# Patient Record
Sex: Male | Born: 1965
Health system: Southern US, Community
[De-identification: ages and names within clinical notes are randomized; demographics above are authoritative.]

## PROBLEM LIST (undated history)

## (undated) DIAGNOSIS — M1990 Unspecified osteoarthritis, unspecified site: Secondary | ICD-10-CM

## (undated) DIAGNOSIS — E785 Hyperlipidemia, unspecified: Secondary | ICD-10-CM

## (undated) DIAGNOSIS — R0609 Other forms of dyspnea: Secondary | ICD-10-CM

## (undated) DIAGNOSIS — Z9109 Other allergy status, other than to drugs and biological substances: Secondary | ICD-10-CM

## (undated) DIAGNOSIS — R0600 Dyspnea, unspecified: Secondary | ICD-10-CM

## (undated) DIAGNOSIS — N200 Calculus of kidney: Secondary | ICD-10-CM

## (undated) DIAGNOSIS — T7840XA Allergy, unspecified, initial encounter: Secondary | ICD-10-CM

## (undated) DIAGNOSIS — K219 Gastro-esophageal reflux disease without esophagitis: Secondary | ICD-10-CM

## (undated) DIAGNOSIS — I10 Essential (primary) hypertension: Secondary | ICD-10-CM

## (undated) HISTORY — DX: Hyperlipidemia, unspecified: E78.5

## (undated) HISTORY — DX: Other forms of dyspnea: R06.09

## (undated) HISTORY — DX: Unspecified osteoarthritis, unspecified site: M19.90

## (undated) HISTORY — PX: KNEE ARTHROSCOPY: SUR90

## (undated) HISTORY — PX: WRIST FRACTURE SURGERY: SHX121

## (undated) HISTORY — DX: Essential (primary) hypertension: I10

## (undated) HISTORY — PX: CARPAL TUNNEL RELEASE: SHX101

## (undated) HISTORY — DX: Calculus of kidney: N20.0

## (undated) HISTORY — DX: Allergy, unspecified, initial encounter: T78.40XA

## (undated) HISTORY — DX: Gastro-esophageal reflux disease without esophagitis: K21.9

## (undated) HISTORY — DX: Dyspnea, unspecified: R06.00

---

## 2000-09-13 ENCOUNTER — Encounter: Payer: Self-pay | Admitting: Otolaryngology

## 2000-09-13 ENCOUNTER — Encounter: Admission: RE | Admit: 2000-09-13 | Discharge: 2000-09-13 | Payer: Self-pay | Admitting: Otolaryngology

## 2001-05-13 ENCOUNTER — Ambulatory Visit (HOSPITAL_COMMUNITY): Admission: RE | Admit: 2001-05-13 | Discharge: 2001-05-13 | Payer: Self-pay | Admitting: Orthopedic Surgery

## 2001-05-13 ENCOUNTER — Encounter: Payer: Self-pay | Admitting: Orthopedic Surgery

## 2004-10-14 ENCOUNTER — Ambulatory Visit (HOSPITAL_COMMUNITY): Admission: RE | Admit: 2004-10-14 | Discharge: 2004-10-14 | Payer: Self-pay | Admitting: Orthopedic Surgery

## 2009-02-05 ENCOUNTER — Ambulatory Visit: Payer: Self-pay | Admitting: Gastroenterology

## 2009-02-05 DIAGNOSIS — R1319 Other dysphagia: Secondary | ICD-10-CM

## 2009-02-05 DIAGNOSIS — K219 Gastro-esophageal reflux disease without esophagitis: Secondary | ICD-10-CM

## 2009-02-18 ENCOUNTER — Ambulatory Visit: Payer: Self-pay | Admitting: Gastroenterology

## 2010-07-15 ENCOUNTER — Encounter: Payer: Self-pay | Admitting: Internal Medicine

## 2010-07-15 ENCOUNTER — Other Ambulatory Visit: Payer: Self-pay | Admitting: Internal Medicine

## 2010-07-15 ENCOUNTER — Other Ambulatory Visit: Payer: 59

## 2010-07-15 ENCOUNTER — Institutional Professional Consult (permissible substitution) (INDEPENDENT_AMBULATORY_CARE_PROVIDER_SITE_OTHER): Payer: 59 | Admitting: Internal Medicine

## 2010-07-15 ENCOUNTER — Ambulatory Visit (INDEPENDENT_AMBULATORY_CARE_PROVIDER_SITE_OTHER)
Admission: RE | Admit: 2010-07-15 | Discharge: 2010-07-15 | Disposition: A | Discharge status: Home / Self Care | Payer: 59 | Source: Ambulatory Visit | Attending: Internal Medicine | Admitting: Internal Medicine

## 2010-07-15 DIAGNOSIS — R0989 Other specified symptoms and signs involving the circulatory and respiratory systems: Secondary | ICD-10-CM | POA: Insufficient documentation

## 2010-07-15 DIAGNOSIS — K219 Gastro-esophageal reflux disease without esophagitis: Secondary | ICD-10-CM

## 2010-07-15 DIAGNOSIS — R0609 Other forms of dyspnea: Secondary | ICD-10-CM

## 2010-07-15 DIAGNOSIS — I10 Essential (primary) hypertension: Secondary | ICD-10-CM

## 2010-07-15 LAB — CBC WITH DIFFERENTIAL/PLATELET
Basophils Absolute: 0 10*3/uL (ref 0.0–0.1)
Basophils Relative: 0.4 % (ref 0.0–3.0)
Eosinophils Absolute: 0.2 10*3/uL (ref 0.0–0.7)
Eosinophils Relative: 3 % (ref 0.0–5.0)
HCT: 44.1 % (ref 39.0–52.0)
Hemoglobin: 15.5 g/dL (ref 13.0–17.0)
Lymphocytes Relative: 28.6 % (ref 12.0–46.0)
Lymphs Abs: 1.9 10*3/uL (ref 0.7–4.0)
MCHC: 35 g/dL (ref 30.0–36.0)
MCV: 93.9 fl (ref 78.0–100.0)
Monocytes Absolute: 0.6 10*3/uL (ref 0.1–1.0)
Monocytes Relative: 9.2 % (ref 3.0–12.0)
Neutro Abs: 4 10*3/uL (ref 1.4–7.7)
Neutrophils Relative %: 58.8 % (ref 43.0–77.0)
Platelets: 132 10*3/uL — ABNORMAL LOW (ref 150.0–400.0)
RBC: 4.7 Mil/uL (ref 4.22–5.81)
RDW: 13.4 % (ref 11.5–14.6)
WBC: 6.8 10*3/uL (ref 4.5–10.5)

## 2010-07-15 LAB — BASIC METABOLIC PANEL
BUN: 15 mg/dL (ref 6–23)
CO2: 29 mEq/L (ref 19–32)
Calcium: 9.5 mg/dL (ref 8.4–10.5)
Chloride: 105 mEq/L (ref 96–112)
Creatinine, Ser: 1.4 mg/dL (ref 0.4–1.5)
GFR: 60.08 mL/min (ref >60.00)
Glucose, Bld: 87 mg/dL (ref 70–99)
Potassium: 4.3 mEq/L (ref 3.5–5.1)
Sodium: 140 mEq/L (ref 135–145)

## 2010-07-15 LAB — BRAIN NATRIURETIC PEPTIDE: Pro B Natriuretic peptide (BNP): 6.7 pg/mL (ref 0.0–100.0)

## 2010-07-19 NOTE — Assessment & Plan Note (Signed)
Summary: Pulmonary/ new pt evaluation/ hfa 90% p coaching   Visit Type:  Initial Consult Copy to:  Self Primary Provider/Referring Provider:  Laural Roes, PA  CC:  DOE.  History of Present Illness: 5 yowm never smoker with allergies going back to childhood "allergic to everything" rec shots by allergist (? sharma) mostly itchy sneezing runny nose  July 15, 2010  1st pulmonary office eval ov cc doe x 3 years, gradually worsening,  first noted playing baseball rx with inhaler no difference, no nocturnal complaints or early am awakening, only ex, no difference indoor or outdoor.  also has hb but has it under good control with omeprazole dosed as needed. assoc with cough on exp with minimal white mucus and nasal congestion.  Pt denies any significant sore throat, dysphagia, itching, sneezing,    excess or purulent nasal secretions,  fever, chills, sweats, unintended wt loss, pleuritic or exertional cp, hempoptysis, change in activity tolerance  orthopnea pnd or leg swelling Pt also denies any obvious fluctuation in symptoms with weather or environmental change or other alleviating or aggravating factors.       Current Medications (verified): 1)  Omeprazole 40 Mg Cpdr (Omeprazole) .Marland Kitchen.. 1 Once Daily 2)  Axiron 30 Mg/act Soln (Testosterone) .... As Directed Every Am  Allergies: 1)  ! Sulfa  Past History:  Past Medical History: Kidney Stones DOE     - Spriometry July 15, 2010 FEV1 = including midflows GERD, clinical dx only  Family History: No FH of Colon Cancer: Family History of Heart Disease: Father Emphysema- MGF Breast CA- Mother  Social History: Occupation: Designer, industrial/product at [employer] Patient has never smoked.  Married with children Alcohol Use - yes- moderate Daily Caffeine Use -3 Illicit Drug Use - no  Review of Systems       The patient complains of shortness of breath with activity, productive cough, non-productive cough, acid heartburn, weight  change, and nasal congestion/difficulty breathing through nose.  The patient denies shortness of breath at rest, coughing up blood, chest pain, irregular heartbeats, indigestion, loss of appetite, abdominal pain, difficulty swallowing, sore throat, tooth/dental problems, headaches, sneezing, itching, ear ache, anxiety, depression, hand/feet swelling, joint stiffness or pain, rash, change in color of mucus, and fever.    Vital Signs:  Patient profile:   45 year old male Height:      71 inches Weight:      232 pounds BMI:     32.47 O2 Sat:      98 % on Room air Temp:     97.9 degrees F oral Pulse rate:   86 / minute BP sitting:   150 / 94  (left arm)  Vitals Entered By: Vernie Murders (July 15, 2010 12:01 PM)  O2 Flow:  Room air  Physical Exam  Additional Exam:  wt 222  >  232 July 15, 2010  robust healthy appearing wm nad HEENT: nl dentition, turbinates, and orophanx. Nl external ear canals without cough reflex NECK :  without JVD/Nodes/TM/ nl carotid upstrokes bilaterally LUNGS: no acc muscle use, trace end exp wheeze with cough on exp maneuvers CV:  RRR  no s3 or murmur or increase in P2, no edema  ABD:  soft and nontender with nl excursion in the supine position. No bruits or organomegaly, bowel sounds nl MS:  warm without deformities, calf tenderness, cyanosis or clubbing SKIN: warm and dry without lesions   NEURO:  alert, approp, no deficits     White Cell Count  6.8 K/uL                    4.5-10.5   Red Cell Count            4.70 Mil/uL                 4.22-5.81   Hemoglobin                15.5 g/dL                   16.1-09.6   Hematocrit                44.1 %                      39.0-52.0   MCV                       93.9 fl                     78.0-100.0   MCHC                      35.0 g/dL                   04.5-40.9   RDW                       13.4 %                      11.5-14.6   Platelet Count       [L]  132.0 K/uL                  150.0-400.0    Neutrophil %              58.8 %                      43.0-77.0   Lymphocyte %              28.6 %                      12.0-46.0   Monocyte %                9.2 %                       3.0-12.0   Eosinophils%              3.0 %                       0.0-5.0   Basophils %               0.4 %                       0.0-3.0   Neutrophill Absolute      4.0 K/uL                    1.4-7.7   Lymphocyte Absolute       1.9 K/uL                    0.7-4.0   Monocyte Absolute         0.6 K/uL  0.1-1.0  Eosinophils, Absolute                             0.2 K/uL                    0.0-0.7   Basophils Absolute        0.0 K/uL                    0.0-0.1  Tests: (2) B-Type Natiuretic Peptide (BNPR)  B-Type Natriuetic Peptide                             6.7 pg/mL                   0.0-100.0  Tests: (3) BMP (METABOL)   Sodium                    140 mEq/L                   135-145   Potassium                 4.3 mEq/L                   3.5-5.1   Chloride                  105 mEq/L                   96-112   Carbon Dioxide            29 mEq/L                    19-32   Glucose                   87 mg/dL                    16-10   BUN                       15 mg/dL                    9-60   Creatinine                1.4 mg/dL                   4.5-4.0   Calcium                   9.5 mg/dL                   9.8-11.9   GFR                       60.08 mL/min                >60.00  CXR  Procedure date:  07/15/2010  Findings:      CHEST - 2 VIEW   Comparison: None.   Findings: Trachea is midline.  Heart size normal.  Lungs are clear. No pleural fluid.   IMPRESSION: No acute findings.  Impression & Recommendations:  Problem # 1:  DYSPNEA ON EXERTION (ICD-786.09)  ? EIA, ? ex exac LPR/ gerd.  against asthma is lack of response  to saba   DDX of  difficult airways managment all start with A and  include Adherence, Ace Inhibitors, Acid Reflux, Active Sinus Disease, Alpha 1  Antitripsin deficiency, Anxiety masquerading as Airways dz,  ABPA,  allergy(esp in young), Aspiration (esp in elderly), Adverse effects of DPI,  Active smokers, plus two Bs  = Bronchiectasis and Beta blocker use..and one C= CHF   Adherence: I spent extra time with the patient today explaining optimal mdi  technique.  This improved from  50-90% try dulera   ? acid reflux:  diet/ ppi reviewed   ? anxiety: dx of exclusion  His updated medication list for this problem includes:    Dulera 100-5 Mcg/act Aero (Mometasone furo-formoterol fum) .Marland Kitchen... 2 puffs first thing  in am and 2 puffs again in pm about 12 hours later  Problem # 2:  HYPERTENSION, BENIGN (ICD-401.1)  discussed non medicaton treatment for now as BNP <<< 100 so strongly doubt contibuting to sob    Problem # 3:  GERD (ICD-530.81)  His updated medication list for this problem includes:    Omeprazole 40 Mg Cpdr (Omeprazole) .Marland Kitchen... Take  one 30-60 min before first meal of the day  clinical dx only  Orders: New Patient Level V (16109)  Medications Added to Medication List This Visit: 1)  Omeprazole 40 Mg Cpdr (Omeprazole) .Marland Kitchen.. 1 once daily 2)  Omeprazole 40 Mg Cpdr (Omeprazole) .... Take  one 30-60 min before first meal of the day 3)  Axiron 30 Mg/act Soln (Testosterone) .... As directed every am 4)  Dulera 100-5 Mcg/act Aero (Mometasone furo-formoterol fum) .... 2 puffs first thing  in am and 2 puffs again in pm about 12 hours later  Other Orders: T-Allergy Profile Region II-DC, [place], [place], [place], [place] (5484) T-2 View CXR (71020TC) TLB-CBC Platelet - w/Differential (85025-CBCD) TLB-BNP (B-Natriuretic Peptide) (83880-BNPR) TLB-BMP (Basic Metabolic Panel-BMET) (80048-METABOL)  Patient Instructions: 1)  Be sure to take omeprazole Take  one 30-60 min before first meal of the day  2)  GERD (REFLUX)  is a common cause of respiratory symptoms. It commonly presents without heartburn and can be treated with medication, but also with  lifestyle changes including avoidance of late meals, excessive alcohol, smoking cessation, and avoid fatty foods, chocolate, peppermint, colas, red wine, and acidic juices such as orange juice. NO MINT OR MENTHOL PRODUCTS SO NO COUGH DROPS  3)  USE SUGARLESS CANDY INSTEAD (jolley ranchers)  4)  NO OIL BASED VITAMINS  5)  Dulera 100 2puff every 12 hours as needed  but especially before exercise hold for up to 5 seconds and then out through the nose 6)  avoid salt 7)  Please schedule a follow-up appointment in 4 weeks, sooner if needed    CXR  Procedure date:  07/15/2010  Findings:      CHEST - 2 VIEW   Comparison: None.   Findings: Trachea is midline.  Heart size normal.  Lungs are clear. No pleural fluid.   IMPRESSION: No acute findings.

## 2010-07-19 NOTE — Letter (Signed)
Summary: [facility]  [address]   [place], [place] [address]   Phone: [phone]  Fax: [phone]    07/15/2010  Shawn Roberts [address] [place], [place]  [address]  [place]  Dear [name],   Please contact our office at [phone] to obtain recent lab and chest x-ray results. Thank You.        Sincerely,   [facility]

## 2010-07-20 ENCOUNTER — Telehealth (INDEPENDENT_AMBULATORY_CARE_PROVIDER_SITE_OTHER): Payer: Self-pay | Admitting: *Deleted

## 2010-07-20 LAB — CONVERTED CEMR LAB: IgE (Immunoglobulin E), Serum: 1.7 intl units/mL (ref 0.0–180.0)

## 2010-07-28 NOTE — Progress Notes (Signed)
Summary: received a letter to call regarding lab resutls  Phone Note Call from Patient   Caller: Patient Call For: wert Summary of Call: patient phoned stated that he recieved a letter stating that he needed to call our office regarding lab resutls. He can be reached at [phone] Initial call taken by: Vedia Coffer,  July 20, 2010 5:08 PM  Follow-up for Phone Call        Spoke with pt and notified of results/recs of cxr and labs per MW append.  Pt verbalized understanding.  Follow-up by: Vernie Murders,  July 20, 2010 5:14 PM

## 2010-08-03 ENCOUNTER — Encounter: Payer: Self-pay | Admitting: Internal Medicine

## 2010-08-12 ENCOUNTER — Ambulatory Visit (INDEPENDENT_AMBULATORY_CARE_PROVIDER_SITE_OTHER): Payer: 59 | Admitting: Internal Medicine

## 2010-08-12 ENCOUNTER — Encounter: Payer: Self-pay | Admitting: Internal Medicine

## 2010-08-12 VITALS — BP 130/80 | HR 91 | Temp 98.4°F | Ht 71.0 in | Wt 226.0 lb

## 2010-08-12 DIAGNOSIS — R05 Cough: Secondary | ICD-10-CM

## 2010-08-12 DIAGNOSIS — R0989 Other specified symptoms and signs involving the circulatory and respiratory systems: Secondary | ICD-10-CM

## 2010-08-12 DIAGNOSIS — R0609 Other forms of dyspnea: Secondary | ICD-10-CM

## 2010-08-12 MED ORDER — MONTELUKAST SODIUM 10 MG PO TABS: 10.0000 mg | ORAL_TABLET | Freq: Every day | ORAL | Status: DC

## 2010-08-12 NOTE — Patient Instructions (Signed)
Start singulair 10 mg one at bedtime and stop all inhalers   For tickling rec you try clortrimeton 4 mg one every 6 hours as needed (Zyrtec or  Clariton options if too sleepy)  If not satisfied call Almyra Free in one week for CPST before and after bronchodilators.  Stay on omeprazole 40 mg 30 min before breakfast and Pepcid 20 mg one at bedtime until completely better.

## 2010-08-12 NOTE — Progress Notes (Signed)
Subjective:    Patient ID: Shawn Roberts, male    DOB: 10-29-1965, 45 y.o.   MRN: [id number]  HPI 47 yowm never smoker with allergies going back to childhood "allergic to everything" rec shots by allergist (? sharma) mostly itchy sneezing runny nose   July 15, 2010 1st pulmonary office eval ov cc doe x 3 years, gradually worsening, first noted playing baseball rx with inhaler no difference, no nocturnal complaints or early am awakening, only ex, no difference indoor or outdoor. also has hb but has it under good control with omeprazole dosed as needed.   Meds 2/24: 1) Omeprazole 40 Mg Cpdr (Omeprazole) .Marland Kitchen.. 1 Once Daily  2) Axiron 30 Mg/act Soln (Testosterone) .... As Directed Every Am    White Cell Count 6.8 K/uL 4.5-10.5  Red Cell Count 4.70 Mil/uL 4.22-5.81  Hemoglobin 15.5 g/dL 40.9-81.1  Hematocrit 91.4 % 39.0-52.0  MCV 93.9 fl 78.0-100.0  MCHC 35.0 g/dL 78.2-95.6  RDW 21.3 % 08.6-57.8  Platelet Count [L] 132.0 K/uL 150.0-400.0  Neutrophil % 58.8 % 43.0-77.0  Lymphocyte % 28.6 % 12.0-46.0  Monocyte % 9.2 % 3.0-12.0  Eosinophils% 3.0 % 0.0-5.0  Basophils % 0.4 % 0.0-3.0  Neutrophill Absolute 4.0 K/uL 1.4-7.7  Lymphocyte Absolute 1.9 K/uL 0.7-4.0  Monocyte Absolute 0.6 K/uL 0.1-1.0  Eosinophils, Absolute  0.2 K/uL 0.0-0.7  Basophils Absolute 0.0 K/uL 0.0-0.1  Tests: (2) B-Type Natiuretic Peptide (BNPR)  B-Type Natriuetic Peptide  6.7 pg/mL 0.0-100.0  Tests: (3) BMP (METABOL)  Sodium 140 mEq/L 135-145  Potassium 4.3 mEq/L 3.5-5.1  Chloride 105 mEq/L 96-112  Carbon Dioxide 29 mEq/L 19-32  Glucose 87 mg/dL 46-96  BUN 15 mg/dL 2-95  Creatinine 1.4 mg/dL 2.8-4.1  Calcium 9.5 mg/dL 3.2-44.0  GFR 10.27 mL/min >60.00  CXR  Procedure date: 07/15/2010  Findings:  CHEST - 2 VIEW No acute findings.   Imp  ? Asthma vs upper airway cough syndrome REC 1)  take omeprazole Take one 30-60 min before first meal of the day  2) GERD (REFLUX)diet      5) Dulera 100 2puff every  12 hours as needed but especially before exercise hold for up to 5 seconds and then out through the nose  6) avoid salt   08/12/10 ov no better at all in terms of control of urge to clear throat or reproducible doe x 2 flts, much different than baseline Pt denies any significant sore throat, dysphagia, itching, sneezing,  nasal congestion or excess/ purulent secretions,  fever, chills, sweats, unintended wt loss, pleuritic or exertional cp, hempoptysis, orthopnea pnd or leg swelling.    Also denies any obvious fluctuation of symptoms with weather or environmental changes or other aggravating or alleviating factors.        Past Medical History:  Kidney Stones  DOE  - Spriometry July 15, 2010 FEV1 = including midflows  GERD, clinical dx only   Family History:  No FH of Colon Cancer:  Family History of Heart Disease: Father  Emphysema- MGF  Breast CA- Mother   Social History:  Occupation: Designer, industrial/product at [employer]  Patient has never smoked.  Married with children  Alcohol Use - yes- moderate  Daily Caffeine Use -3  Illicit Drug Use - no  Additional Exam: wt 222 > 232 July 15, 2010  robust healthy appearing wm nad  HEENT: nl dentition, turbinates, and orophanx. Nl external ear canals without cough reflex  NECK : without JVD/Nodes/TM/ nl carotid upstrokes bilaterally  LUNGS: no acc muscle use, trace  end exp wheeze with cough on exp maneuvers  CV: RRR no s3 or murmur or increase in P2, no edema  ABD: soft and nontender with nl excursion in the supine position. No bruits or organomegaly, bowel sounds nl  MS: warm without deformities, calf tenderness, cyanosis or clubbing  SKIN: warm and dry without lesions  NEURO: alert, approp, no deficits     Review of Systems     Objective:   Physical Exam    Additional Exam: wt 222 > 232 July 15, 2010  > 226 07/14/10 robust healthy appearing wm nad  HEENT: nl dentition, turbinates, and orophanx. Nl external ear canals  without cough reflex  NECK : without JVD/Nodes/TM/ nl carotid upstrokes bilaterally  LUNGS: no acc muscle use, trace end exp wheeze with cough on exp maneuvers  CV: RRR no s3 or murmur or increase in P2, no edema  ABD: soft and nontender with nl excursion in the supine position. No bruits or organomegaly, bowel sounds nl  MS: warm without deformities, calf tenderness, cyanosis or clubbing  SKIN: warm and dry without lesions  NEURO: alert, approp, no deficits     Assessment & Plan:

## 2010-08-13 ENCOUNTER — Encounter: Payer: Self-pay | Admitting: Internal Medicine

## 2010-08-13 DIAGNOSIS — R059 Cough, unspecified: Secondary | ICD-10-CM | POA: Insufficient documentation

## 2010-08-13 DIAGNOSIS — R05 Cough: Secondary | ICD-10-CM | POA: Insufficient documentation

## 2010-08-13 NOTE — Assessment & Plan Note (Addendum)
stronly doubt asthma as cause as no benefit from duleara, albeit with suboptimal hfa  Try singulair since it may also help with apparent pnds but next step is CPST with spirometry before and after

## 2010-08-13 NOTE — Assessment & Plan Note (Signed)
Classic Upper airway cough syndrome, so named because it's frequently impossible to sort out how much is  CR/sinusitis with freq throat clearing (which can be related to primary GERD)   vs  causing  secondary (" extra esophageal")  GERD from wide swings in gastric pressure that occur with throat clearing, often  promoting self use of mint and menthol lozenges that reduce the lower esophageal sphincter tone and exacerbate the problem further in a cyclical fashion.   These are the same pts who not infrequently have failed to tolerate ace inhibitors,  dry powder inhalers or biphosphonates or report having reflux symptoms that don't respond to standard doses of PPI , and are easily confused as having aecopd or asthma flares,    So need to continue gerd rx for now PLUS  Start singulair 10 mg one at bedtime and stop all inhalers and add H1 per guidelines

## 2013-02-25 ENCOUNTER — Encounter: Payer: Self-pay | Admitting: Gastroenterology

## 2013-04-02 ENCOUNTER — Encounter: Payer: Self-pay | Admitting: Gastroenterology

## 2013-04-02 ENCOUNTER — Ambulatory Visit (INDEPENDENT_AMBULATORY_CARE_PROVIDER_SITE_OTHER): Payer: 59 | Admitting: Gastroenterology

## 2013-04-02 VITALS — BP 120/80 | HR 96 | Ht 71.0 in | Wt 231.0 lb

## 2013-04-02 DIAGNOSIS — R140 Abdominal distension (gaseous): Secondary | ICD-10-CM

## 2013-04-02 DIAGNOSIS — R141 Gas pain: Secondary | ICD-10-CM

## 2013-04-02 DIAGNOSIS — K219 Gastro-esophageal reflux disease without esophagitis: Secondary | ICD-10-CM

## 2013-04-02 DIAGNOSIS — R197 Diarrhea, unspecified: Secondary | ICD-10-CM

## 2013-04-02 NOTE — Patient Instructions (Addendum)
Follow instructions on Hemoccult cards and mail them back to [place] when finished.   You have been given a Low gas diet.   Take over the counter Gas-x three times a day with meals for gas and bloating.  We have given you a coupon for Align. This puts good bacteria back into your colon. You should take 1 capsule by mouth once daily. This can be purchased over the counter.   Thank you for choosing me and [facility].  Venita Lick. Pleas Koch., MD., Clementeen Graham  cc: Lawerance Sabal, [place]

## 2013-04-02 NOTE — Progress Notes (Signed)
    History of Present Illness: This is a 47 year old male who relates a several month history of postprandial generalized abdominal bloating associated with increased gas, belching and flatulence. He has occasional episodes of diarrhea that are not correlated with his bloating. His father has a history of colon polyps around age 21 however the patient relates that the polyps were not precancerous. He underwent upper endoscopy in 2010 for GERD and dysphasia examination was normal. He's been maintained on omeprazole 40 mg daily with good control of his reflux symptoms Denies weight loss, abdominal pain, constipation, change in stool caliber, melena, hematochezia, nausea, vomiting, dysphagia, reflux symptoms, chest pain.  Review of Systems: Pertinent positive and negative review of systems were noted in the above HPI section. All other review of systems were otherwise negative.  Current Medications, Allergies, Past Medical History, Past Surgical History, Family History and Social History were reviewed in [employer] record.  Physical Exam: General: Well developed , well nourished, no acute distress Head: Normocephalic and atraumatic Eyes:  sclerae anicteric, EOMI Ears: Normal auditory acuity Mouth: No deformity or lesions Neck: Supple, no masses or thyromegaly Lungs: Clear throughout to auscultation Heart: Regular rate and rhythm; no murmurs, rubs or bruits Abdomen: Soft, non tender and non distended. No masses, hepatosplenomegaly or hernias noted. Normal Bowel sounds Musculoskeletal: Symmetrical with no gross deformities  Skin: No lesions on visible extremities Pulses:  Normal pulses noted Extremities: No clubbing, cyanosis, edema or deformities noted Neurological: Alert oriented x 4, grossly nonfocal Cervical Nodes:  No significant cervical adenopathy Inguinal Nodes: No significant inguinal adenopathy Psychological:  Alert and cooperative. Normal mood and  affect  Assessment and Recommendations:  1. Abdominal bloating, gas, belching, flatulence and intermittent diarrhea.. Obtain stool Hemoccults and recent blood work from his PCPs office. Begin a low gas diet. Begin daily Align. Begin Gas-X 4 times a day when necessary. Return office is in 2-3 weeks. If Hemoccults are positive or symptoms have not abated consider colonoscopy for further evaluation. If symptoms abate plan for routine screening colonoscopy at age 64.  2. GERD. Continue omeprazole 40 mg daily and standard antireflux measures. EGD in 2010 was normal.

## 2013-04-16 ENCOUNTER — Other Ambulatory Visit (INDEPENDENT_AMBULATORY_CARE_PROVIDER_SITE_OTHER): Payer: 59

## 2013-04-16 DIAGNOSIS — R141 Gas pain: Secondary | ICD-10-CM

## 2013-04-16 DIAGNOSIS — R140 Abdominal distension (gaseous): Secondary | ICD-10-CM

## 2013-04-16 DIAGNOSIS — R197 Diarrhea, unspecified: Secondary | ICD-10-CM

## 2013-04-16 LAB — HEMOCCULT SLIDES (X 3 CARDS)
OCCULT 1: NEGATIVE
OCCULT 3: NEGATIVE
OCCULT 4: NEGATIVE
OCCULT 5: NEGATIVE

## 2013-04-29 ENCOUNTER — Ambulatory Visit: Payer: 59 | Admitting: Gastroenterology

## 2013-07-10 ENCOUNTER — Encounter (HOSPITAL_COMMUNITY): Payer: Self-pay | Admitting: Pharmacy Technician

## 2013-07-11 ENCOUNTER — Encounter (HOSPITAL_COMMUNITY): Payer: Self-pay

## 2013-07-11 ENCOUNTER — Encounter (HOSPITAL_COMMUNITY)
Admission: RE | Admit: 2013-07-11 | Discharge: 2013-07-11 | Disposition: A | Discharge status: Home / Self Care | Payer: 59 | Source: Ambulatory Visit | Attending: Orthopedic Surgery | Admitting: Orthopedic Surgery

## 2013-07-11 ENCOUNTER — Other Ambulatory Visit (HOSPITAL_COMMUNITY): Payer: Self-pay | Admitting: *Deleted

## 2013-07-11 DIAGNOSIS — Z01812 Encounter for preprocedural laboratory examination: Secondary | ICD-10-CM | POA: Insufficient documentation

## 2013-07-11 HISTORY — DX: Other allergy status, other than to drugs and biological substances: Z91.09

## 2013-07-11 LAB — CBC
HCT: 43.9 % (ref 39.0–52.0)
Hemoglobin: 15.8 g/dL (ref 13.0–17.0)
MCH: 32.4 pg (ref 26.0–34.0)
MCHC: 36 g/dL (ref 30.0–36.0)
MCV: 90.1 fL (ref 78.0–100.0)
PLATELETS: 162 10*3/uL (ref 150–400)
RBC: 4.87 MIL/uL (ref 4.22–5.81)
RDW: 12.7 % (ref 11.5–15.5)
WBC: 5.8 10*3/uL (ref 4.0–10.5)

## 2013-07-11 NOTE — Progress Notes (Signed)
Called Dr. Susie Cassette office for pre-op orders. Left message with Abigail Butts (scheduler).

## 2013-07-11 NOTE — Pre-Procedure Instructions (Signed)
Shawn Roberts  07/11/2013   Your procedure is scheduled on:  Tuesday, July 15, 2013 at 2:00 PM.   Report to [facility] Entrance "A" Admitting Office at 12:00 PM.   Call this number if you have problems the morning of surgery: [phone]   Remember:   Do not eat food or drink liquids after midnight Monday, 07/14/13.   Take these medicines the morning of surgery with A SIP OF WATER: omeprazole (PRILOSEC)    Do not wear jewelry.  Do not wear lotions, powders, or cologne. You may NOT wear deodorant.  Do not shave 48 hours prior to surgery. Men may shave face and neck.  Do not bring valuables to the hospital.  [facility] is not responsible                  for any belongings or valuables.               Contacts, dentures or bridgework may not be worn into surgery.  Leave suitcase in the car. After surgery it may be brought to your room.  For patients admitted to the hospital, discharge time is determined by your                treatment team.               Patients discharged the day of surgery will not be allowed to drive  home.  Name and phone number of your driver: Family/friend   Special Instructions: [facility] - Preparing for Surgery  Before surgery, you can play an important role.  Because skin is not sterile, your skin needs to be as free of germs as possible.  You can reduce the number of germs on you skin by washing with CHG (chlorahexidine gluconate) soap before surgery.  CHG is an antiseptic cleaner which kills germs and bonds with the skin to continue killing germs even after washing.  Please DO NOT use if you have an allergy to CHG or antibacterial soaps.  If your skin becomes reddened/irritated stop using the CHG and inform your nurse when you arrive at Short Stay.  Do not shave (including legs and underarms) for at least 48 hours prior to the first CHG shower.  You may shave your face.  Please follow these instructions carefully:   1.  Shower with CHG  Soap the night before surgery and the                                morning of Surgery.  2.  If you choose to wash your hair, wash your hair first as usual with your       normal shampoo.  3.  After you shampoo, rinse your hair and body thoroughly to remove the                      Shampoo.  4.  Use CHG as you would any other liquid soap.  You can apply chg directly       to the skin and wash gently with scrungie or a clean washcloth.  5.  Apply the CHG Soap to your body ONLY FROM THE NECK DOWN.        Do not use on open wounds or open sores.  Avoid contact with your eyes, ears, mouth and genitals (private parts).  Wash genitals (private parts) with your normal soap.  6.  Wash thoroughly, paying special attention to the area where your surgery        will be performed.  7.  Thoroughly rinse your body with warm water from the neck down.  8.  DO NOT shower/wash with your normal soap after using and rinsing off       the CHG Soap.  9.  Pat yourself dry with a clean towel.            10.  Wear clean pajamas.            11.  Place clean sheets on your bed the night of your first shower and do not        sleep with pets.  Day of Surgery  Do not apply any lotions/deodorants the morning of surgery.  Please wear clean clothes to the hospital/surgery center.      Please read over the following fact sheets that you were given: Pain Booklet, Coughing and Deep Breathing and Surgical Site Infection Prevention

## 2013-07-14 MED ORDER — LACTATED RINGERS IV SOLN
INTRAVENOUS | Status: DC
  Administered 2013-07-15: 12:00:00 via INTRAVENOUS

## 2013-07-14 MED ORDER — CHLORHEXIDINE GLUCONATE 4 % EX LIQD
60.0000 mL | Freq: Once | CUTANEOUS | Status: DC
  Filled 2013-07-14: qty 60

## 2013-07-14 MED ORDER — CEFAZOLIN SODIUM-DEXTROSE 2-3 GM-% IV SOLR
2.0000 g | INTRAVENOUS | Status: AC
  Administered 2013-07-15: 2 g via INTRAVENOUS
  Filled 2013-07-14: qty 50

## 2013-07-15 ENCOUNTER — Ambulatory Visit (HOSPITAL_COMMUNITY)
Admission: RE | Admit: 2013-07-15 | Discharge: 2013-07-15 | Disposition: A | Discharge status: Home / Self Care | Payer: 59 | Source: Ambulatory Visit | Attending: Orthopedic Surgery | Admitting: Orthopedic Surgery

## 2013-07-15 ENCOUNTER — Encounter (HOSPITAL_COMMUNITY)
Admission: RE | Disposition: A | Discharge status: Home / Self Care | Payer: Self-pay | Source: Ambulatory Visit | Attending: Orthopedic Surgery

## 2013-07-15 ENCOUNTER — Encounter (HOSPITAL_COMMUNITY): Payer: Self-pay | Admitting: *Deleted

## 2013-07-15 ENCOUNTER — Encounter (HOSPITAL_COMMUNITY): Payer: 59 | Admitting: Anesthesiology

## 2013-07-15 ENCOUNTER — Ambulatory Visit (HOSPITAL_COMMUNITY): Payer: 59 | Admitting: Anesthesiology

## 2013-07-15 DIAGNOSIS — K219 Gastro-esophageal reflux disease without esophagitis: Secondary | ICD-10-CM | POA: Insufficient documentation

## 2013-07-15 DIAGNOSIS — X58XXXA Exposure to other specified factors, initial encounter: Secondary | ICD-10-CM | POA: Insufficient documentation

## 2013-07-15 DIAGNOSIS — M249 Joint derangement, unspecified: Secondary | ICD-10-CM | POA: Insufficient documentation

## 2013-07-15 DIAGNOSIS — IMO0002 Reserved for concepts with insufficient information to code with codable children: Secondary | ICD-10-CM | POA: Insufficient documentation

## 2013-07-15 DIAGNOSIS — M75100 Unspecified rotator cuff tear or rupture of unspecified shoulder, not specified as traumatic: Secondary | ICD-10-CM | POA: Insufficient documentation

## 2013-07-15 DIAGNOSIS — S43439A Superior glenoid labrum lesion of unspecified shoulder, initial encounter: Secondary | ICD-10-CM | POA: Insufficient documentation

## 2013-07-15 DIAGNOSIS — M9420 Chondromalacia, unspecified site: Secondary | ICD-10-CM | POA: Insufficient documentation

## 2013-07-15 HISTORY — PX: SHOULDER ARTHROSCOPY WITH DEBRIDEMENT AND BICEP TENDON REPAIR: SHX5690

## 2013-07-15 LAB — PROTIME-INR
INR: 0.99 (ref 0.00–1.49)
PROTHROMBIN TIME: 12.9 s (ref 11.6–15.2)

## 2013-07-15 LAB — COMPREHENSIVE METABOLIC PANEL
ALBUMIN: 4.3 g/dL (ref 3.5–5.2)
ALK PHOS: 61 U/L (ref 39–117)
ALT: 38 U/L (ref 0–53)
AST: 24 U/L (ref 0–37)
BUN: 17 mg/dL (ref 6–23)
CHLORIDE: 103 meq/L (ref 96–112)
CO2: 27 mEq/L (ref 19–32)
CREATININE: 1.23 mg/dL (ref 0.50–1.35)
Calcium: 9.8 mg/dL (ref 8.4–10.5)
GFR calc Af Amer: 80 mL/min — ABNORMAL LOW (ref >90)
GFR calc non Af Amer: 69 mL/min — ABNORMAL LOW (ref >90)
Glucose, Bld: 92 mg/dL (ref 70–99)
POTASSIUM: 4.4 meq/L (ref 3.7–5.3)
Sodium: 144 mEq/L (ref 137–147)
Total Bilirubin: 0.6 mg/dL (ref 0.3–1.2)
Total Protein: 7.5 g/dL (ref 6.0–8.3)

## 2013-07-15 LAB — DIFFERENTIAL
BASOS ABS: 0 10*3/uL (ref 0.0–0.1)
BASOS PCT: 0 % (ref 0–1)
EOS ABS: 0.1 10*3/uL (ref 0.0–0.7)
Eosinophils Relative: 2 % (ref 0–5)
Lymphocytes Relative: 35 % (ref 12–46)
Lymphs Abs: 2.4 10*3/uL (ref 0.7–4.0)
Monocytes Absolute: 0.5 10*3/uL (ref 0.1–1.0)
Monocytes Relative: 7 % (ref 3–12)
NEUTROS ABS: 3.8 10*3/uL (ref 1.7–7.7)
NEUTROS PCT: 56 % (ref 43–77)

## 2013-07-15 LAB — APTT: aPTT: 25 seconds (ref 24–37)

## 2013-07-15 SURGERY — SHOULDER ARTHROSCOPY WITH DEBRIDEMENT AND BICEP TENDON REPAIR
Anesthesia: General | Site: Shoulder | Laterality: Right

## 2013-07-15 MED ORDER — ROCURONIUM BROMIDE 100 MG/10ML IV SOLN
INTRAVENOUS | Status: DC | PRN
  Administered 2013-07-15: 30 mg via INTRAVENOUS
  Administered 2013-07-15: 20 mg via INTRAVENOUS

## 2013-07-15 MED ORDER — OXYCODONE-ACETAMINOPHEN 5-325 MG PO TABS
ORAL_TABLET | ORAL | Status: AC
  Filled 2013-07-15: qty 2

## 2013-07-15 MED ORDER — FENTANYL CITRATE 0.05 MG/ML IJ SOLN
INTRAMUSCULAR | Status: AC
  Filled 2013-07-15: qty 5

## 2013-07-15 MED ORDER — LABETALOL HCL 5 MG/ML IV SOLN
INTRAVENOUS | Status: AC
  Filled 2013-07-15: qty 4

## 2013-07-15 MED ORDER — HYDROMORPHONE HCL PF 1 MG/ML IJ SOLN
0.2500 mg | INTRAMUSCULAR | Status: DC | PRN
  Administered 2013-07-15 (×4): 0.5 mg via INTRAVENOUS

## 2013-07-15 MED ORDER — ONDANSETRON HCL 4 MG/2ML IJ SOLN: 4.0000 mg | Freq: Once | INTRAMUSCULAR | Status: DC | PRN

## 2013-07-15 MED ORDER — PROPOFOL 10 MG/ML IV BOLUS
INTRAVENOUS | Status: DC | PRN
  Administered 2013-07-15: 40 mg via INTRAVENOUS
  Administered 2013-07-15: 360 mg via INTRAVENOUS

## 2013-07-15 MED ORDER — LACTATED RINGERS IV SOLN
INTRAVENOUS | Status: DC | PRN
  Administered 2013-07-15: 13:00:00 via INTRAVENOUS

## 2013-07-15 MED ORDER — SUCCINYLCHOLINE CHLORIDE 20 MG/ML IJ SOLN
INTRAMUSCULAR | Status: DC | PRN
Start: 2013-07-15 — End: 2013-07-15
  Administered 2013-07-15: 100 mg via INTRAVENOUS

## 2013-07-15 MED ORDER — DIAZEPAM 5 MG PO TABS: 2.5000 mg | ORAL_TABLET | Freq: Four times a day (QID) | ORAL | Status: DC | PRN

## 2013-07-15 MED ORDER — OXYCODONE-ACETAMINOPHEN 5-325 MG PO TABS: 1.0000 | ORAL_TABLET | ORAL | Status: DC | PRN

## 2013-07-15 MED ORDER — ONDANSETRON HCL 4 MG/2ML IJ SOLN
INTRAMUSCULAR | Status: AC
  Filled 2013-07-15: qty 2

## 2013-07-15 MED ORDER — SODIUM CHLORIDE 0.9 % IR SOLN
Status: DC | PRN
  Administered 2013-07-15: 3000 mL

## 2013-07-15 MED ORDER — FENTANYL CITRATE 0.05 MG/ML IJ SOLN
INTRAMUSCULAR | Status: DC | PRN
  Administered 2013-07-15: 100 ug via INTRAVENOUS

## 2013-07-15 MED ORDER — BUPIVACAINE-EPINEPHRINE PF 0.5-1:200000 % IJ SOLN
INTRAMUSCULAR | Status: DC | PRN
  Administered 2013-07-15: 15 mL via PERINEURAL

## 2013-07-15 MED ORDER — OXYCODONE-ACETAMINOPHEN 5-325 MG PO TABS
2.0000 | ORAL_TABLET | ORAL | Status: AC
  Administered 2013-07-15: 2 via ORAL

## 2013-07-15 MED ORDER — GLYCOPYRROLATE 0.2 MG/ML IJ SOLN
INTRAMUSCULAR | Status: DC | PRN
  Administered 2013-07-15: .4 mg via INTRAVENOUS

## 2013-07-15 MED ORDER — DIAZEPAM 5 MG PO TABS
ORAL_TABLET | ORAL | Status: AC
  Filled 2013-07-15: qty 1

## 2013-07-15 MED ORDER — HYDROMORPHONE HCL PF 1 MG/ML IJ SOLN
INTRAMUSCULAR | Status: AC
  Filled 2013-07-15: qty 1

## 2013-07-15 MED ORDER — LIDOCAINE HCL (CARDIAC) 20 MG/ML IV SOLN
INTRAVENOUS | Status: AC
  Filled 2013-07-15: qty 5

## 2013-07-15 MED ORDER — NEOSTIGMINE METHYLSULFATE 1 MG/ML IJ SOLN
INTRAMUSCULAR | Status: DC | PRN
  Administered 2013-07-15: 2 mg via INTRAVENOUS

## 2013-07-15 MED ORDER — ROCURONIUM BROMIDE 50 MG/5ML IV SOLN
INTRAVENOUS | Status: AC
  Filled 2013-07-15: qty 1

## 2013-07-15 MED ORDER — NAPROXEN 500 MG PO TABS: 500.0000 mg | ORAL_TABLET | Freq: Two times a day (BID) | ORAL | Status: DC

## 2013-07-15 MED ORDER — LIDOCAINE HCL 4 % MT SOLN
OROMUCOSAL | Status: DC | PRN
  Administered 2013-07-15: 2 mL via TOPICAL

## 2013-07-15 MED ORDER — LABETALOL HCL 5 MG/ML IV SOLN
INTRAVENOUS | Status: DC | PRN
  Administered 2013-07-15: 5 mg via INTRAVENOUS

## 2013-07-15 MED ORDER — PROPOFOL 10 MG/ML IV BOLUS
INTRAVENOUS | Status: AC
  Filled 2013-07-15: qty 20

## 2013-07-15 MED ORDER — DIAZEPAM 5 MG PO TABS
5.0000 mg | ORAL_TABLET | Freq: Once | ORAL | Status: AC
  Administered 2013-07-15: 5 mg via ORAL

## 2013-07-15 MED ORDER — GLYCOPYRROLATE 0.2 MG/ML IJ SOLN
INTRAMUSCULAR | Status: AC
  Filled 2013-07-15: qty 2

## 2013-07-15 MED ORDER — MIDAZOLAM HCL 2 MG/2ML IJ SOLN
INTRAMUSCULAR | Status: AC
  Filled 2013-07-15: qty 2

## 2013-07-15 MED ORDER — ONDANSETRON HCL 4 MG/2ML IJ SOLN
INTRAMUSCULAR | Status: DC | PRN
  Administered 2013-07-15: 4 mg via INTRAVENOUS

## 2013-07-15 MED ORDER — NEOSTIGMINE METHYLSULFATE 1 MG/ML IJ SOLN
INTRAMUSCULAR | Status: AC
  Filled 2013-07-15: qty 10

## 2013-07-15 MED ORDER — LIDOCAINE HCL (CARDIAC) 20 MG/ML IV SOLN
INTRAVENOUS | Status: DC | PRN
  Administered 2013-07-15: 100 mg via INTRAVENOUS

## 2013-07-15 SURGICAL SUPPLY — 65 items
BLADE CUTTER GATOR 3.5 (BLADE) ×3 IMPLANT
BLADE GREAT WHITE 4.2 (BLADE) ×2 IMPLANT
BLADE GREAT WHITE 4.2MM (BLADE) ×1
BLADE SURG 11 STRL SS (BLADE) ×3 IMPLANT
BOOTCOVER CLEANROOM LRG (PROTECTIVE WEAR) ×6 IMPLANT
BUR 3.5 LG SPHERICAL (BURR) ×1 IMPLANT
BUR OVAL 4.0 (BURR) ×3 IMPLANT
BURR 3.5 LG SPHERICAL (BURR) ×2
BURR 3.5MM LG SPHERICAL (BURR) ×1
CANISTER SUCT LVC 12 LTR MEDI- (MISCELLANEOUS) ×3 IMPLANT
CANNULA ACUFLEX KIT 5X76 (CANNULA) ×3 IMPLANT
CANNULA DRILOCK 5.0MMX75MM (CANNULA)
CANNULA DRILOCK 5.0X75 (CANNULA) IMPLANT
CLOSURE STERI-STRIP 1/4X4 (GAUZE/BANDAGES/DRESSINGS) ×3 IMPLANT
CLOSURE WOUND 1/2 X4 (GAUZE/BANDAGES/DRESSINGS) ×1
CLOTH BEACON ORANGE TIMEOUT ST (SAFETY) ×3 IMPLANT
CONNECTOR 5 IN 1 STRAIGHT STRL (MISCELLANEOUS) ×3 IMPLANT
DRAPE INCISE 23X17 IOBAN STRL (DRAPES) ×2
DRAPE INCISE IOBAN 23X17 STRL (DRAPES) ×1 IMPLANT
DRAPE INCISE IOBAN 66X45 STRL (DRAPES) ×3 IMPLANT
DRAPE STERI 35X30 U-POUCH (DRAPES) ×3 IMPLANT
DRAPE SURG 17X11 SM STRL (DRAPES) ×3 IMPLANT
DRAPE U-SHAPE 47X51 STRL (DRAPES) ×3 IMPLANT
DRSG PAD ABDOMINAL 8X10 ST (GAUZE/BANDAGES/DRESSINGS) ×6 IMPLANT
DURAPREP 26ML APPLICATOR (WOUND CARE) ×6 IMPLANT
ELECT REM PT RETURN 9FT ADLT (ELECTROSURGICAL) ×3
ELECTRODE REM PT RTRN 9FT ADLT (ELECTROSURGICAL) ×1 IMPLANT
GLOVE BIO SURGEON STRL SZ7.5 (GLOVE) ×3 IMPLANT
GLOVE BIO SURGEON STRL SZ8 (GLOVE) ×3 IMPLANT
GLOVE EUDERMIC 7 POWDERFREE (GLOVE) ×3 IMPLANT
GLOVE SS BIOGEL STRL SZ 7.5 (GLOVE) ×1 IMPLANT
GLOVE SUPERSENSE BIOGEL SZ 7.5 (GLOVE) ×2
GOWN STRL NON-REIN LRG LVL3 (GOWN DISPOSABLE) ×3 IMPLANT
GOWN STRL REIN XL XLG (GOWN DISPOSABLE) ×12 IMPLANT
KIT BASIN OR (CUSTOM PROCEDURE TRAY) ×3 IMPLANT
KIT BIO-TENODESIS 3X8 DISP (MISCELLANEOUS)
KIT INSRT BABSR STRL DISP BTN (MISCELLANEOUS) IMPLANT
KIT ROOM TURNOVER OR (KITS) ×3 IMPLANT
KIT SHOULDER TRACTION (DRAPES) ×3 IMPLANT
MANIFOLD NEPTUNE II (INSTRUMENTS) ×3 IMPLANT
NDL SUT 6 .5 CRC .975X.05 MAYO (NEEDLE) ×1 IMPLANT
NEEDLE MAYO TAPER (NEEDLE) ×2
NEEDLE SPNL 18GX3.5 QUINCKE PK (NEEDLE) ×3 IMPLANT
NS IRRIG 1000ML POUR BTL (IV SOLUTION) ×3 IMPLANT
PACK SHOULDER (CUSTOM PROCEDURE TRAY) ×3 IMPLANT
PAD ABD 8X10 STRL (GAUZE/BANDAGES/DRESSINGS) ×3 IMPLANT
PAD ARMBOARD 7.5X6 YLW CONV (MISCELLANEOUS) ×6 IMPLANT
SET ARTHROSCOPY TUBING (MISCELLANEOUS) ×2
SET ARTHROSCOPY TUBING LN (MISCELLANEOUS) ×1 IMPLANT
SLING ARM LRG ADULT FOAM STRAP (SOFTGOODS) ×3 IMPLANT
SLING ARM MED ADULT FOAM STRAP (SOFTGOODS) ×3 IMPLANT
SPONGE GAUZE 4X4 12PLY (GAUZE/BANDAGES/DRESSINGS) ×3 IMPLANT
SPONGE GAUZE 4X4 12PLY STER LF (GAUZE/BANDAGES/DRESSINGS) ×3 IMPLANT
SPONGE LAP 4X18 X RAY DECT (DISPOSABLE) ×3 IMPLANT
STRIP CLOSURE SKIN 1/2X4 (GAUZE/BANDAGES/DRESSINGS) ×2 IMPLANT
SUT MNCRL AB 3-0 PS2 18 (SUTURE) ×3 IMPLANT
SUT PDS AB 0 CT 36 (SUTURE) ×3 IMPLANT
SUT RETRIEVER GRASP 30 DEG (SUTURE) IMPLANT
SYR 20CC LL (SYRINGE) ×3 IMPLANT
TAPE PAPER 2X10 WHT MICROPORE (GAUZE/BANDAGES/DRESSINGS) ×3 IMPLANT
TAPE PAPER 3X10 WHT MICROPORE (GAUZE/BANDAGES/DRESSINGS) ×3 IMPLANT
TOWEL OR 17X24 6PK STRL BLUE (TOWEL DISPOSABLE) ×3 IMPLANT
TOWEL OR 17X26 10 PK STRL BLUE (TOWEL DISPOSABLE) ×3 IMPLANT
WAND SUCTION MAX 4MM 90S (SURGICAL WAND) ×3 IMPLANT
WATER STERILE IRR 1000ML POUR (IV SOLUTION) ×3 IMPLANT

## 2013-07-15 NOTE — Anesthesia Postprocedure Evaluation (Signed)
  Anesthesia Post-op Note  Patient: Shawn Roberts  Procedure(s) Performed: Procedure(s): RIGHT SHOULDER ARTHROSCOPY WITH LABERAL DEBRIDMENT    (Right)  Patient Location: PACU  Anesthesia Type:General and GA combined with regional for post-op pain  Level of Consciousness: awake, alert , oriented and patient cooperative  Airway and Oxygen Therapy: Patient Spontanous Breathing  Post-op Pain: mild  Post-op Assessment: Post-op Vital signs reviewed, Patient's Cardiovascular Status Stable, Respiratory Function Stable, Patent Airway, No signs of Nausea or vomiting and Pain level controlled  Post-op Vital Signs: stable  Complications: No apparent anesthesia complications

## 2013-07-15 NOTE — Anesthesia Preprocedure Evaluation (Signed)
Anesthesia Evaluation  Patient identified by MRN, date of birth, ID band Patient awake    Reviewed: Allergy & Precautions, H&P , NPO status , Patient's Chart, lab work & pertinent test results  Airway       Dental   Pulmonary          Cardiovascular     Neuro/Psych    GI/Hepatic GERD-  ,  Endo/Other    Renal/GU Renal disease     Musculoskeletal   Abdominal   Peds  Hematology   Anesthesia Other Findings   Reproductive/Obstetrics                           Anesthesia Physical Anesthesia Plan  ASA: I  Anesthesia Plan: General   Post-op Pain Management:    Induction: Intravenous  Airway Management Planned: Oral ETT  Additional Equipment:   Intra-op Plan:   Post-operative Plan: Extubation in OR  Informed Consent: I have reviewed the patients History and Physical, chart, labs and discussed the procedure including the risks, benefits and alternatives for the proposed anesthesia with the patient or authorized representative who has indicated his/her understanding and acceptance.     Plan Discussed with:   Anesthesia Plan Comments:         Anesthesia Quick Evaluation

## 2013-07-15 NOTE — H&P (Signed)
Shawn Roberts    Chief Complaint: RIGHT SHOULDER LABERAL TEAR HPI: The patient is a 48 y.o. male with chronic right shoulder pain refractory to conservative Rx.  Past Medical History  Diagnosis Date  . GERD (gastroesophageal reflux disease)   . Pneumonia   . DOE (dyspnea on exertion)     usually with allergies or sinus problems  . Environmental allergies   . Kidney stones     Past Surgical History  Procedure Laterality Date  . Knee arthroscopy    . Wrist fracture surgery      right hand  . Carpal tunnel release      right    Family History  Problem Relation Age of Onset  . Heart disease Father   . Colon polyps Father   . Emphysema Maternal Grandfather   . Breast cancer Mother     Social History:  reports that he has never smoked. He has never used smokeless tobacco. He reports that he drinks alcohol. He reports that he does not use illicit drugs.  Allergies:  Allergies  Allergen Reactions  . Sulfonamide Derivatives     REACTION: hives    Medications Prior to Admission  Medication Sig Dispense Refill  . levofloxacin (LEVAQUIN) 500 MG tablet Take 500 mg by mouth daily.      Marland Kitchen omeprazole (PRILOSEC) 40 MG capsule Take 40 mg by mouth daily. 30-60 minutes before first meal of day      . Multiple Vitamin (MULTIVITAMIN WITH MINERALS) TABS tablet Take 1 tablet by mouth daily.         Physical Exam: right shoulder with painful and restricted motion as noted at recent office visits  Vitals  Temp:  [98.6 F (37 C)] 98.6 F (37 C) (02/24 1140) Pulse Rate:  [71-77] 71 (02/24 1222) Resp:  [10-26] 10 (02/24 1222) BP: (139-170)/(80-101) 139/86 mmHg (02/24 1222) SpO2:  [99 %] 99 % (02/24 1222)  Assessment/Plan  Impression: RIGHT SHOULDER LABERAL TEAR  Plan of Action: Procedure(s): RIGHT SHOULDER ARTHROSCOPY WITH LABERAL DEBRIDMENT  VS REPAIR OF POSSIBLE BICEP TENDOSIS  Shawn Roberts 07/15/2013, 12:33 PM

## 2013-07-15 NOTE — Op Note (Signed)
NAME:  AMAURI, KEEFE NO.:  [id number]  MEDICAL RECORD NO.:  [id number]  LOCATION:  [facility]                         FACILITY:  [facility]  PHYSICIAN:  Metta Clines. Asencion Loveday, M.D.  DATE OF BIRTH:  07/15/1965  DATE OF PROCEDURE:  07/15/2013 DATE OF DISCHARGE:  07/15/2013                              OPERATIVE REPORT   PREOPERATIVE DIAGNOSES:  Chronic right shoulder pain and mechanical symptoms with MR evidence for degenerative labral tear and associated paralabral cyst.  POSTOPERATIVE DIAGNOSES: 1. Right shoulder circumferential degenerative labral tear. 2. Right shoulder glenohumeral chondromalacia, grade 3 and 4 diffusely     on the glenoid and grade 1 on the humeral head. 3. Subacromial bursitis.  PROCEDURES: 1. Right shoulder examination under anesthesia. 2. Right shoulder glenohumeral joint diagnostic arthroscopy. 3. Debridement of circumferential degenerative labral tear. 4. Chondroplasty of the glenoid.  SURGEON:  Metta Clines. Loyalty Brashier, M.D.  Terrence DupontOlivia Mackie A. Shuford, P.A.-C.  ANESTHESIA:  General endotracheal as well as an interscalene block.  ESTIMATED BLOOD LOSS:  Minimal.  DRAINS:  None.  HISTORY:  Mr. Sakuma is a 48 year old gentleman who has had a long history of right shoulder pain with progressive increasing functional limitations and symptoms that have been refractory to prolonged attempts at conservative management.  His preoperative MRI scan shows evidence for a significant degenerative circumferential labral tear and associated paralabral cyst.  No obvious bony impingement and the rotator cuff was felt to be intact.  Due to his ongoing pain, functional limitations and failure to respond to prolonged attempts at conservative management, he was brought to the operating room at this time for planned right shoulder arthroscopy as described below.  Preoperatively, I counseled Mr. clerk on treatment options as well as risks versus benefits thereof.   Possible surgical complications were reviewed including potential for bleeding, infection, neurovascular injury, persistent pain, loss of motion, anesthetic complication, possible need for additional surgery.  He understands and accepts and agrees with our planned procedure.  PROCEDURE IN DETAIL:  After undergoing routine preop evaluation, the patient received prophylactic antibiotics.  An interscalene block was established in the holding area by the Anesthesia Department.  Placed supine on the operating table, underwent smooth induction of a general endotracheal anesthesia.  Turned to the left lateral decubitus position on the beanbag and appropriately padded and protected.  Right shoulder examination under anesthesia revealed full motion and carefully examined for instability, did not identify any obvious instability patterns. Right arm was then suspended at 70 degrees of abduction with 15 pounds of traction and the right shoulder girdle region was sterilely prepped and draped in standard fashion.  Time-out was called.  Posterior portal was established to the glenohumeral joint and the anterior portal was established under direct visualization.  Initial inspection showed obvious extensive degenerative tearing of the labrum circumferentially with large fragments of loose pedunculated labral tissue, which were all debrided with shaver.  I did not identify any obvious instability patterns.  Once the labrum had been debrided back to the stable margin, found that there was an advanced chondromalacia diffusely on the glenoid with an area of full-thickness cartilage loss anteroinferiorly as well as posterosuperiorly with number of  loose chondral flaps surrounding these regions and the shear was used to perform the chondroplasty of the glenoid down to all stable margins and stable cartilaginous base.  The humeral head showed some superficial fraying, but no significant chondral flaps and the  rotator cuff was carefully inspected and found to be intact, which showed some very minimal superficial fraying, but no obvious debrideable lesions.  The biceps was carefully inspected and probed, and did not identify any evidence to suggest instability of the proximal biceps anchor or distally at the bicipital groove and the caliber of the biceps tendon itself was normal and no obvious degeneration or tearing was noted.  I should note that there was certainly a type 1 SLAP lesion with tearing of the superior labrum, and this was debrided with the biceps anchor itself was felt to be stable. At this point, final inspection and irrigation were then completed within the glenoid joint.  Fluid and instruments were removed.  The arm was dropped down to 30 degrees of abduction with the arthroscope introduced in the subacromial space to the posterior portal and a direct lateral portal in the subacromial space.  Abundant dense bursal tissue and multiple adhesions were encountered.  These were all divided and excised with a combination of shaver and Stryker wand.  Wand was used to obtain hemostasis.  The overall bony morphology was not particularly unfavorable and there was no evidence from bony impingement anterolaterally.  Tear of the rotator cuff was carefully inspected from the bursal side and found to be intact.  At this point, we completed the bursectomy and hemostasis was obtained.  Fluid and instruments were removed.  The portals were closed with Monocryl and Steri-Strips.  A dry dressing was taped on the right shoulder.  Right arm was placed in a sling.  The patient was awakened, extubated, and taken to the recovery room in stable condition.  Jenetta Loges, PA-C was used as an Environmental consultant throughout this case and help with positioning of the patient, positioning of the extremity, management of the arthroscopic equipment, tissue manipulations, wound closure, and intraoperative decision  making.     Metta Clines. Nidya Bouyer, M.D.     KMS/MEDQ  D:  07/15/2013  T:  07/15/2013  Job:  [id number]

## 2013-07-15 NOTE — Transfer of Care (Signed)
Immediate Anesthesia Transfer of Care Note  Patient: Shawn Roberts  Procedure(s) Performed: Procedure(s): RIGHT SHOULDER ARTHROSCOPY WITH LABERAL DEBRIDMENT    (Right)  Patient Location: PACU  Anesthesia Type:General  Level of Consciousness: awake, sedated and patient cooperative  Airway & Oxygen Therapy: Patient Spontanous Breathing and Patient connected to nasal cannula oxygen  Post-op Assessment: Report given to PACU RN, Post -op Vital signs reviewed and stable and Patient moving all extremities  Post vital signs: Reviewed and stable  Complications: No apparent anesthesia complications

## 2013-07-15 NOTE — Anesthesia Procedure Notes (Signed)
Anesthesia Regional Block:  Interscalene brachial plexus block  Pre-Anesthetic Checklist: ,, timeout performed, Correct Patient, Correct Site, Correct Laterality, Correct Procedure, Correct Position, site marked, Risks and benefits discussed,  Surgical consent,  Pre-op evaluation,  At surgeon's request and post-op pain management  Laterality: Right  Prep: chloraprep and alcohol swabs       Needles:  Injection technique: Single-shot  Needle Type: Stimulator Needle - 40        Needle insertion depth: 4 cm   Additional Needles:  Procedures: nerve stimulator Interscalene brachial plexus block  Nerve Stimulator or Paresthesia:  Response: 0.5 mA, 0.1 ms, 4 cm  Additional Responses:   Narrative:  Start time: 07/15/2013 12:30 PM End time: 07/15/2013 12:35 PM Injection made incrementally with aspirations every 5 mL.  Performed by: Personally  Anesthesiologist: Sharolyn Douglas MD  Additional Notes: Pt accepts procedure w/ risks. 15cc 0.5% Marcaine w/ epi w/o difficulty or discomfort. GES

## 2013-07-15 NOTE — Op Note (Signed)
07/15/2013  2:04 PM  PATIENT:   Shawn Roberts  48 y.o. male  PRE-OPERATIVE DIAGNOSIS:  RIGHT SHOULDER LABERAL TEAR  POST-OPERATIVE DIAGNOSIS:  Degenerative right shoulder labral tear, glenohumeral chondromalacia, subacromial bursitis  PROCEDURE:  RSA, labral debridement, chondroplasty, SAB  SURGEON:  Deanza Upperman, Metta Clines. M.D.  ASSISTANTS: Shuford pac   ANESTHESIA:   GET + ISB  EBL: min  SPECIMEN:  none  Drains: none   PATIENT DISPOSITION:  PACU - hemodynamically stable.    PLAN OF CARE: Discharge to home after PACU  Dictation# [id number]

## 2013-07-15 NOTE — Discharge Instructions (Signed)
° °Kevin M. Supple, M.D., F.A.A.O.S. °Orthopaedic Surgery °Specializing in Arthroscopic and Reconstructive °Surgery of the Shoulder and Knee °[phone] °[address] - [place], [place] [address] - Fax [phone] ° ° °POST-OP SHOULDER ARTHROSCOPY INSTRUCTIONS ° °1. Call the office at [phone] to schedule your first post-op appointment 7-10 days from the date of your surgery. ° °2. Leave the steri-strips in place over your incisions when performing dressing changes and showering. You may remove your dressings and begin showering 72 hours from surgery. You can expect drainage that is clear to bloody in nature that occasionally will soak through your dressings. If this occurs go ahead and perform a dressing change. The drainage should lessen daily and when there is no drainage from your incisions feel free to go without a dressing. ° °3. Wear your sling for comfort. You may come out of your sling for ad lib activity and even decide not to use the sling at all. If you find you are more comfortable in your sling, make sure you come out of your sling at least 3-4 times a day to do the exercises that are included below. ° °4. Range of motion to your elbow, wrist, and hand are encouraged 3-5 times daily. Exercise to your hand and fingers helps to reduce swelling you may experience. ° °5. Utilize ice to the shoulder 3-4 times minimum a day and additionally if you are experiencing pain. ° °6. You may drive when safely off narcotics and muscle relaxants. ° °7. If you had a block pre-operatively to provide post-op pain relief you may want to go ahead and begin utilizing your pain meds as your arm begins to wake up. Blocks can sometimes last up to 16-18 hours. If you are still pain-free prior to going to bed you may want to strongly consider taking a pain medication to avoid being awakened in the night with the onset of pain. A muscle relaxant is also provided for you should you experience muscle spasms. It  is recommended that if you are experiencing pain that your pain medication alone is not controlling, add the muscle relaxant along with the pain medication which can give additional pain relief. The first one to two days is generally the most severe of your pain and then should gradually decrease. As your pain lessens it is recommended that you decrease your use of the pain medications to an "as needed basis" only and to always comply with the recommended dosages of the pain medications. ° °8. Pain medications can produce constipation along with their use. If you experience this, the use of an over the counter stool softener or laxative daily is recommended.  ° °9. For additional questions or concerns, please do not hesitate to call the office. If after hours there is an answering service to forward your concerns to the physician on call. ° ° °POST-OP EXERCISES ° °The pendulum exercises should be performed while bending at the waist as far over as possible thereby letting gravity do the work for you. ° °Range of Motion Exercises: Pendulum (circular) ° °Repeat 20 times. Do 3 sessions per day. ° ° ° ° °Range of Motion Exercises: Pendulum (side-to-side) ° °Repeat 20 times. Do 3 sessions per day. ° ° ° °Range of Motion Exercises (self-stretching activities): ° °Slide arm up wall with palm toward you, moving closer to the wall. Hold for 5 seconds. ° °Repeat 10 times. Do 3 sessions per day. ° ° ° ° ° °What to eat: ° °For your   first meals, you should eat lightly; only small meals initially.  If you do not have nausea, you may eat larger meals.  Avoid spicy, greasy and heavy food.   ° °General Anesthesia, Adult, Care After  °Refer to this sheet in the next few weeks. These instructions provide you with information on caring for yourself after your procedure. Your health care provider may also give you more specific instructions. Your treatment has been planned according to current medical practices, but problems sometimes  occur. Call your health care provider if you have any problems or questions after your procedure.  °WHAT TO EXPECT AFTER THE PROCEDURE  °After the procedure, it is typical to experience:  °Sleepiness.  °Nausea and vomiting. °HOME CARE INSTRUCTIONS  °For the first 24 hours after general anesthesia:  °Have a responsible person with you.  °Do not drive a car. If you are alone, do not take public transportation.  °Do not drink alcohol.  °Do not take medicine that has not been prescribed by your health care provider.  °Do not sign important papers or make important decisions.  °You may resume a normal diet and activities as directed by your health care provider.  °Change bandages (dressings) as directed.  °If you have questions or problems that seem related to general anesthesia, call the hospital and ask for the anesthetist or anesthesiologist on call. °SEEK MEDICAL CARE IF:  °You have nausea and vomiting that continue the day after anesthesia.  °You develop a rash. °SEEK IMMEDIATE MEDICAL CARE IF:  °You have difficulty breathing.  °You have chest pain.  °You have any allergic problems. °Document Released: 08/14/2000 Document Revised: 01/08/2013 Document Reviewed: 11/21/2012  °Exit[employer]® Patient Information ©2014 [employer].  ° ° °

## 2013-07-16 ENCOUNTER — Encounter (HOSPITAL_COMMUNITY): Payer: Self-pay | Admitting: Orthopedic Surgery

## 2013-07-28 NOTE — Addendum Note (Signed)
Addendum created 07/28/13 0834 by Kate Sable, MD   Modules edited: Anesthesia Attestations, Anesthesia Events

## 2013-10-15 ENCOUNTER — Ambulatory Visit: Payer: 59 | Attending: Orthopedic Surgery | Admitting: Physical Therapy

## 2013-10-15 DIAGNOSIS — IMO0001 Reserved for inherently not codable concepts without codable children: Secondary | ICD-10-CM | POA: Diagnosis not present

## 2013-10-15 DIAGNOSIS — Z9889 Other specified postprocedural states: Secondary | ICD-10-CM | POA: Diagnosis not present

## 2013-10-15 DIAGNOSIS — R5381 Other malaise: Secondary | ICD-10-CM | POA: Diagnosis not present

## 2013-10-15 DIAGNOSIS — M25519 Pain in unspecified shoulder: Secondary | ICD-10-CM | POA: Diagnosis not present

## 2013-10-17 ENCOUNTER — Ambulatory Visit: Payer: 59 | Admitting: Physical Therapy

## 2013-10-17 DIAGNOSIS — IMO0001 Reserved for inherently not codable concepts without codable children: Secondary | ICD-10-CM | POA: Diagnosis not present

## 2013-10-24 ENCOUNTER — Ambulatory Visit: Payer: 59 | Attending: Orthopedic Surgery | Admitting: Physical Therapy

## 2013-10-24 DIAGNOSIS — M25519 Pain in unspecified shoulder: Secondary | ICD-10-CM | POA: Insufficient documentation

## 2013-10-24 DIAGNOSIS — R5381 Other malaise: Secondary | ICD-10-CM | POA: Diagnosis not present

## 2013-10-24 DIAGNOSIS — Z9889 Other specified postprocedural states: Secondary | ICD-10-CM | POA: Insufficient documentation

## 2013-10-24 DIAGNOSIS — IMO0001 Reserved for inherently not codable concepts without codable children: Secondary | ICD-10-CM | POA: Diagnosis present

## 2014-09-25 ENCOUNTER — Encounter: Payer: Self-pay | Admitting: Gastroenterology

## 2015-06-07 ENCOUNTER — Other Ambulatory Visit: Payer: Self-pay | Admitting: Physician Assistant

## 2015-06-07 NOTE — H&P (Signed)
Shawn Roberts comes in with two issues.  New patient to the office.  First is his right elbow.  He has been very active in sports, including baseball in the past.  This has been a gradual onset of global soreness in the elbow.  A number of years.  This is getting worse.  More and more difficulty trying to get full extension.  Global intraarticular soreness, but the focus of this tends to be posterior compartment impingement limiting extension.  Really nothing medial or lateral.  No specific history of trauma.  No numbness.  No tingling.  No previous intervention.   Other issue is his left hand.  Progressive numbness going on for more than a year.  Getting worse rather than better.  History of carpal tunnel syndrome evaluated and treated on the right with carpal tunnel release in early 2000's.  No operative intervention on the left.   This feels exactly the same as the opposite side.  He has given this time.  He has progressed to the point where he would like to do something definitive.   Remaining history and general exam is reviewed.    EXAMINATION: Specifically, healthy appearing 50 year-old.  Height: 5?11.  Weight: 223 pounds.  Lungs clear to auscultation bilaterally.  Heart sounds normal.  On exam he has a well healed incision, carpal tunnel release on the right.  On the left he has positive Tinel's.  Positive Phalen's.  Decreased sensation median nerve distribution.  There is no atrophy.  Good pulses and capillary refill.  Full motion of all joints.  On the right he has obvious posterior impingement, right elbow.  5-7 degree relatively hard flexion contracture.  A little soreness globally front on both sides, but there are no ligamentous signs.  No evidence of medial or lateral epicondylitis.  Biceps and triceps intact.  Marked pain when I try to force more extension.    X-RAYS: Three view x-rays of the left wrist shows no degenerative change.  No bony encroachment on carpal tunnel view.  Two view x-rays of  the right elbow shows a picture of posterior spurring at the olecranon, consistent with posterior impingement.  The other compartments are well preserved.    IMPRESSION/PLAN: 1. In regards to the left hand, carpal tunnel syndrome.  EMG nerve conduction studies.  Follow up when that is complete.  In all likelihood he is going to go in the direction of carpal tunnel release and he understands.   2. Right elbow.  I think this is primarily posterior impingement.  Going on for years and starting to get the better of him.  Starting to affect all activities, including daily living.  MRI to look at all structures.  Follow up with me when that is complete.  We have talked a little bit about what can be done with arthroscopic decompression posterior elbow.  I want to see his scan and discuss it further with him when he follows up.  He understands.  We will talk further when he follows up.  Obviously we cannot do both sides at the same time.    [name], M.D.  Addendum: X-rays and MRI shows a picture of posterior impingement.  Although there are global changes in the elbow the most profound impact is posteriorly, which is what I expected.  I have reviewed the MRI, the report and his previous x-rays.  I went over his exam.  More than 25 minutes spent face-to-face covering both of these things.  We  are going to do his elbow first.  Exam under anesthesia, arthroscopy.  Extensive debridement, especially posterior compartment.  Once he is sufficiently recovered we will go ahead and proceed with carpal tunnel release on his opposite side.  He understands and agrees.  See him at the time of operative intervention.    [name], M.D.

## 2015-06-10 ENCOUNTER — Encounter (HOSPITAL_BASED_OUTPATIENT_CLINIC_OR_DEPARTMENT_OTHER): Payer: Self-pay | Admitting: *Deleted

## 2015-06-17 ENCOUNTER — Ambulatory Visit (HOSPITAL_BASED_OUTPATIENT_CLINIC_OR_DEPARTMENT_OTHER): Payer: Commercial Managed Care - HMO | Admitting: Anesthesiology

## 2015-06-17 ENCOUNTER — Encounter (HOSPITAL_BASED_OUTPATIENT_CLINIC_OR_DEPARTMENT_OTHER): Payer: Self-pay | Admitting: *Deleted

## 2015-06-17 ENCOUNTER — Encounter (HOSPITAL_BASED_OUTPATIENT_CLINIC_OR_DEPARTMENT_OTHER)
Admission: RE | Disposition: A | Discharge status: Home / Self Care | Payer: Self-pay | Source: Ambulatory Visit | Attending: Orthopedic Surgery

## 2015-06-17 ENCOUNTER — Ambulatory Visit (HOSPITAL_BASED_OUTPATIENT_CLINIC_OR_DEPARTMENT_OTHER)
Admission: RE | Admit: 2015-06-17 | Discharge: 2015-06-17 | Disposition: A | Discharge status: Home / Self Care | Payer: Commercial Managed Care - HMO | Source: Ambulatory Visit | Attending: Orthopedic Surgery | Admitting: Orthopedic Surgery

## 2015-06-17 DIAGNOSIS — Z79899 Other long term (current) drug therapy: Secondary | ICD-10-CM | POA: Diagnosis not present

## 2015-06-17 DIAGNOSIS — Z6831 Body mass index (BMI) 31.0-31.9, adult: Secondary | ICD-10-CM | POA: Insufficient documentation

## 2015-06-17 DIAGNOSIS — M779 Enthesopathy, unspecified: Secondary | ICD-10-CM | POA: Insufficient documentation

## 2015-06-17 DIAGNOSIS — K219 Gastro-esophageal reflux disease without esophagitis: Secondary | ICD-10-CM | POA: Diagnosis not present

## 2015-06-17 DIAGNOSIS — Z791 Long term (current) use of non-steroidal anti-inflammatories (NSAID): Secondary | ICD-10-CM | POA: Diagnosis not present

## 2015-06-17 DIAGNOSIS — M25821 Other specified joint disorders, right elbow: Secondary | ICD-10-CM | POA: Diagnosis not present

## 2015-06-17 DIAGNOSIS — M19021 Primary osteoarthritis, right elbow: Secondary | ICD-10-CM | POA: Insufficient documentation

## 2015-06-17 DIAGNOSIS — G5602 Carpal tunnel syndrome, left upper limb: Secondary | ICD-10-CM | POA: Insufficient documentation

## 2015-06-17 DIAGNOSIS — I10 Essential (primary) hypertension: Secondary | ICD-10-CM | POA: Insufficient documentation

## 2015-06-17 HISTORY — PX: ELBOW ARTHROSCOPY: SHX614

## 2015-06-17 SURGERY — ARTHROSCOPY, ELBOW, WITH OPEN SURGERY IF INDICATED
Anesthesia: General | Site: Elbow | Laterality: Right

## 2015-06-17 MED ORDER — FENTANYL CITRATE (PF) 100 MCG/2ML IJ SOLN
50.0000 ug | INTRAMUSCULAR | Status: AC | PRN
  Administered 2015-06-17: 25 ug via INTRAVENOUS
  Administered 2015-06-17: 50 ug via INTRAVENOUS
  Administered 2015-06-17: 25 ug via INTRAVENOUS
  Administered 2015-06-17: 100 ug via INTRAVENOUS

## 2015-06-17 MED ORDER — FENTANYL CITRATE (PF) 100 MCG/2ML IJ SOLN
INTRAMUSCULAR | Status: AC
  Filled 2015-06-17: qty 2

## 2015-06-17 MED ORDER — CHLORHEXIDINE GLUCONATE 4 % EX LIQD: 60.0000 mL | Freq: Once | CUTANEOUS | Status: DC

## 2015-06-17 MED ORDER — BUPIVACAINE-EPINEPHRINE (PF) 0.5% -1:200000 IJ SOLN
INTRAMUSCULAR | Status: DC | PRN
  Administered 2015-06-17: 25 mL via PERINEURAL

## 2015-06-17 MED ORDER — ONDANSETRON HCL 4 MG/2ML IJ SOLN
INTRAMUSCULAR | Status: DC | PRN
  Administered 2015-06-17: 4 mg via INTRAVENOUS

## 2015-06-17 MED ORDER — LIDOCAINE HCL (CARDIAC) 20 MG/ML IV SOLN
INTRAVENOUS | Status: DC | PRN
  Administered 2015-06-17: 60 mg via INTRAVENOUS

## 2015-06-17 MED ORDER — DEXAMETHASONE SODIUM PHOSPHATE 4 MG/ML IJ SOLN
INTRAMUSCULAR | Status: DC | PRN
  Administered 2015-06-17: 10 mg via INTRAVENOUS

## 2015-06-17 MED ORDER — CEFAZOLIN SODIUM-DEXTROSE 2-3 GM-% IV SOLR
2.0000 g | INTRAVENOUS | Status: AC
  Administered 2015-06-17: 2 g via INTRAVENOUS

## 2015-06-17 MED ORDER — LIDOCAINE HCL (CARDIAC) 20 MG/ML IV SOLN
INTRAVENOUS | Status: AC
  Filled 2015-06-17: qty 5

## 2015-06-17 MED ORDER — PROPOFOL 500 MG/50ML IV EMUL
INTRAVENOUS | Status: AC
  Filled 2015-06-17: qty 50

## 2015-06-17 MED ORDER — HYDROMORPHONE HCL 1 MG/ML IJ SOLN
0.2500 mg | INTRAMUSCULAR | Status: DC | PRN
  Administered 2015-06-17 (×3): 0.5 mg via INTRAVENOUS

## 2015-06-17 MED ORDER — MIDAZOLAM HCL 2 MG/2ML IJ SOLN
1.0000 mg | INTRAMUSCULAR | Status: DC | PRN
  Administered 2015-06-17: 2 mg via INTRAVENOUS

## 2015-06-17 MED ORDER — SUCCINYLCHOLINE CHLORIDE 20 MG/ML IJ SOLN
INTRAMUSCULAR | Status: AC
  Filled 2015-06-17: qty 1

## 2015-06-17 MED ORDER — MEPERIDINE HCL 25 MG/ML IJ SOLN: 6.2500 mg | INTRAMUSCULAR | Status: DC | PRN

## 2015-06-17 MED ORDER — HYDROMORPHONE HCL 1 MG/ML IJ SOLN
INTRAMUSCULAR | Status: AC
  Filled 2015-06-17: qty 1

## 2015-06-17 MED ORDER — LACTATED RINGERS IV SOLN
INTRAVENOUS | Status: DC
  Administered 2015-06-17: 07:00:00 via INTRAVENOUS

## 2015-06-17 MED ORDER — OXYCODONE HCL 5 MG PO TABS
ORAL_TABLET | ORAL | Status: AC
  Filled 2015-06-17: qty 1

## 2015-06-17 MED ORDER — SCOPOLAMINE 1 MG/3DAYS TD PT72: 1.0000 | MEDICATED_PATCH | Freq: Once | TRANSDERMAL | Status: DC | PRN

## 2015-06-17 MED ORDER — GLYCOPYRROLATE 0.2 MG/ML IJ SOLN: 0.2000 mg | Freq: Once | INTRAMUSCULAR | Status: DC | PRN

## 2015-06-17 MED ORDER — MIDAZOLAM HCL 2 MG/2ML IJ SOLN
INTRAMUSCULAR | Status: AC
  Filled 2015-06-17: qty 2

## 2015-06-17 MED ORDER — CEFAZOLIN SODIUM-DEXTROSE 2-3 GM-% IV SOLR
INTRAVENOUS | Status: AC
  Filled 2015-06-17: qty 50

## 2015-06-17 MED ORDER — ONDANSETRON HCL 4 MG PO TABS: 4.0000 mg | ORAL_TABLET | Freq: Three times a day (TID) | ORAL | Status: DC | PRN

## 2015-06-17 MED ORDER — OXYCODONE-ACETAMINOPHEN 5-325 MG PO TABS: 1.0000 | ORAL_TABLET | ORAL | Status: DC | PRN

## 2015-06-17 MED ORDER — OXYCODONE HCL 5 MG/5ML PO SOLN: 5.0000 mg | Freq: Once | ORAL | Status: AC | PRN

## 2015-06-17 MED ORDER — PROPOFOL 10 MG/ML IV BOLUS
INTRAVENOUS | Status: DC | PRN
  Administered 2015-06-17: 300 mg via INTRAVENOUS

## 2015-06-17 MED ORDER — OXYCODONE HCL 5 MG PO TABS
5.0000 mg | ORAL_TABLET | Freq: Once | ORAL | Status: AC | PRN
  Administered 2015-06-17: 5 mg via ORAL

## 2015-06-17 MED ORDER — DEXAMETHASONE SODIUM PHOSPHATE 10 MG/ML IJ SOLN
INTRAMUSCULAR | Status: AC
  Filled 2015-06-17: qty 1

## 2015-06-17 SURGICAL SUPPLY — 71 items
BANDAGE ACE 4X5 VEL STRL LF (GAUZE/BANDAGES/DRESSINGS) ×6 IMPLANT
BENZOIN TINCTURE PRP APPL 2/3 (GAUZE/BANDAGES/DRESSINGS) IMPLANT
BLADE CUDA GRT WHITE 3.5 (BLADE) ×3 IMPLANT
BLADE CUDA SHAVER 3.5 (BLADE) IMPLANT
BLADE CUTTER GATOR 3.5 (BLADE) IMPLANT
BLADE GREAT WHITE 4.2 (BLADE) IMPLANT
BLADE GREAT WHITE 4.2MM (BLADE)
BNDG ESMARK 4X9 LF (GAUZE/BANDAGES/DRESSINGS) ×3 IMPLANT
BUR CUDA 2.9 (BURR) IMPLANT
BUR CUDA 2.9MM (BURR)
BUR FULL RADIUS 2.9 (BURR) IMPLANT
BUR FULL RADIUS 2.9MM (BURR)
BUR GATOR 2.9 (BURR) IMPLANT
BUR GATOR 2.9MM (BURR)
BUR OVAL 4.0 (BURR) ×3 IMPLANT
CANISTER SUCT 1200ML W/VALVE (MISCELLANEOUS) ×3 IMPLANT
CLOSURE WOUND 1/2 X4 (GAUZE/BANDAGES/DRESSINGS)
CUFF TOURNIQUET SINGLE 18IN (TOURNIQUET CUFF) ×3 IMPLANT
CUTTER MENISCUS  4.2MM (BLADE)
CUTTER MENISCUS 4.2MM (BLADE) IMPLANT
DECANTER SPIKE VIAL GLASS SM (MISCELLANEOUS) IMPLANT
DRAPE ARTHROSCOPY W/POUCH 90 (DRAPES) ×3 IMPLANT
DRAPE OEC MINIVIEW 54X84 (DRAPES) IMPLANT
DRAPE U-SHAPE 47X51 STRL (DRAPES) ×3 IMPLANT
DURAPREP 26ML APPLICATOR (WOUND CARE) ×3 IMPLANT
GAUZE SPONGE 4X4 12PLY STRL (GAUZE/BANDAGES/DRESSINGS) ×6 IMPLANT
GAUZE XEROFORM 1X8 LF (GAUZE/BANDAGES/DRESSINGS) IMPLANT
GLOVE BIO SURGEON STRL SZ 6.5 (GLOVE) ×2 IMPLANT
GLOVE BIO SURGEONS STRL SZ 6.5 (GLOVE) ×1
GLOVE BIOGEL PI IND STRL 7.0 (GLOVE) ×4 IMPLANT
GLOVE BIOGEL PI IND STRL 7.5 (GLOVE) ×1 IMPLANT
GLOVE BIOGEL PI INDICATOR 7.0 (GLOVE) ×8
GLOVE BIOGEL PI INDICATOR 7.5 (GLOVE) ×2
GLOVE ECLIPSE 7.0 STRL STRAW (GLOVE) ×6 IMPLANT
GLOVE SURG ORTHO 8.0 STRL STRW (GLOVE) ×3 IMPLANT
GOWN STRL REUS W/ TWL LRG LVL3 (GOWN DISPOSABLE) ×2 IMPLANT
GOWN STRL REUS W/ TWL XL LVL3 (GOWN DISPOSABLE) ×2 IMPLANT
GOWN STRL REUS W/TWL LRG LVL3 (GOWN DISPOSABLE) ×4
GOWN STRL REUS W/TWL XL LVL3 (GOWN DISPOSABLE) ×4
IV SOD CHL 0.9% 1000ML (IV SOLUTION) ×12 IMPLANT
KIT SHOULDER TRACTION (DRAPES) ×3 IMPLANT
NDL SAFETY ECLIPSE 18X1.5 (NEEDLE) IMPLANT
NEEDLE HYPO 18GX1.5 SHARP (NEEDLE)
NEEDLE HYPO 25X1 1.5 SAFETY (NEEDLE) IMPLANT
NS IRRIG 1000ML POUR BTL (IV SOLUTION) IMPLANT
PACK ARTHROSCOPY DSU (CUSTOM PROCEDURE TRAY) ×3 IMPLANT
PACK BASIN DAY SURGERY FS (CUSTOM PROCEDURE TRAY) ×3 IMPLANT
PAD CAST 4YDX4 CTTN HI CHSV (CAST SUPPLIES) ×2 IMPLANT
PADDING CAST ABS 4INX4YD NS (CAST SUPPLIES) ×2
PADDING CAST ABS COTTON 4X4 ST (CAST SUPPLIES) ×1 IMPLANT
PADDING CAST COTTON 4X4 STRL (CAST SUPPLIES) ×4
SET ARTHROSCOPY TUBING (MISCELLANEOUS) ×2
SET ARTHROSCOPY TUBING LN (MISCELLANEOUS) ×1 IMPLANT
SHEET MEDIUM DRAPE 40X70 STRL (DRAPES) ×3 IMPLANT
SLEEVE SCD COMPRESS KNEE MED (MISCELLANEOUS) IMPLANT
SLING ARM FOAM STRAP LRG (SOFTGOODS) IMPLANT
SLING ARM MED ADULT FOAM STRAP (SOFTGOODS) IMPLANT
SPLINT FAST PLASTER 5X30 (CAST SUPPLIES)
SPLINT PLASTER CAST FAST 5X30 (CAST SUPPLIES) IMPLANT
STRIP CLOSURE SKIN 1/2X4 (GAUZE/BANDAGES/DRESSINGS) IMPLANT
SUT ETHILON 3 0 PS 1 (SUTURE) IMPLANT
SUT MNCRL AB 4-0 PS2 18 (SUTURE) ×3 IMPLANT
SUT VIC AB 0 CT1 27 (SUTURE)
SUT VIC AB 0 CT1 27XBRD ANBCTR (SUTURE) IMPLANT
SUT VIC AB 2-0 PS2 27 (SUTURE) IMPLANT
SUT VIC AB 2-0 SH 27 (SUTURE) ×2
SUT VIC AB 2-0 SH 27XBRD (SUTURE) ×1 IMPLANT
SYR CONTROL 10ML LL (SYRINGE) IMPLANT
TOWEL OR 17X24 6PK STRL BLUE (TOWEL DISPOSABLE) ×6 IMPLANT
UNDERPAD 30X30 (UNDERPADS AND DIAPERS) ×3 IMPLANT
WATER STERILE IRR 1000ML POUR (IV SOLUTION) ×3 IMPLANT

## 2015-06-17 NOTE — Anesthesia Preprocedure Evaluation (Signed)
Anesthesia Evaluation  Patient identified by MRN, date of birth, ID band  Reviewed: Allergy & Precautions, NPO status , Patient's Chart, lab work & pertinent test results  Airway Mallampati: I  TM Distance: >3 FB Neck ROM: Full    Dental  (+) Teeth Intact, Dental Advisory Given   Pulmonary    breath sounds clear to auscultation       Cardiovascular hypertension, Pt. on medications  Rhythm:Regular Rate:Normal     Neuro/Psych    GI/Hepatic GERD  Medicated and Controlled,  Endo/Other  Morbid obesity  Renal/GU      Musculoskeletal   Abdominal   Peds  Hematology   Anesthesia Other Findings   Reproductive/Obstetrics                             Anesthesia Physical Anesthesia Plan  ASA: II  Anesthesia Plan: General   Post-op Pain Management: MAC Combined w/ Regional for Post-op pain   Induction: Intravenous  Airway Management Planned: LMA  Additional Equipment:   Intra-op Plan:   Post-operative Plan: Extubation in OR  Informed Consent: I have reviewed the patients History and Physical, chart, labs and discussed the procedure including the risks, benefits and alternatives for the proposed anesthesia with the patient or authorized representative who has indicated his/her understanding and acceptance.   Dental advisory given  Plan Discussed with: CRNA, Anesthesiologist and Surgeon  Anesthesia Plan Comments:         Anesthesia Quick Evaluation

## 2015-06-17 NOTE — Anesthesia Procedure Notes (Addendum)
Anesthesia Regional Block:  Supraclavicular block  Pre-Anesthetic Checklist: ,, timeout performed, Correct Patient, Correct Site, Correct Laterality, Correct Procedure, Correct Position, site marked, Risks and benefits discussed,  Surgical consent,  Pre-op evaluation,  At surgeon's request and post-op pain management  Laterality: Right and Upper  Prep: chloraprep       Needles:  Injection technique: Single-shot  Needle Type: Echogenic Stimulator Needle     Needle Length: 5cm 5 cm Needle Gauge: 21 and 21 G    Additional Needles:  Procedures: ultrasound guided (picture in chart) Supraclavicular block Narrative:  Start time: 06/17/2015 6:58 AM End time: 06/17/2015 7:05 AM Injection made incrementally with aspirations every 5 mL.  Performed by: Personally  Anesthesiologist: CREWS, DAVID   Procedure Name: LMA Insertion Performed by: Terrance Mass Pre-anesthesia Checklist: Patient identified, Emergency Drugs available, Suction available and Patient being monitored Patient Re-evaluated:Patient Re-evaluated prior to inductionOxygen Delivery Method: Circle System Utilized Preoxygenation: Pre-oxygenation with 100% oxygen Intubation Type: IV induction Ventilation: Mask ventilation without difficulty LMA: LMA inserted LMA Size: 5.0 Number of attempts: 1 Airway Equipment and Method: Bite block Placement Confirmation: positive ETCO2 Tube secured with: Tape Dental Injury: Teeth and Oropharynx as per pre-operative assessment        R SCB image

## 2015-06-17 NOTE — Transfer of Care (Signed)
Immediate Anesthesia Transfer of Care Note  Patient: Shawn Roberts  Procedure(s) Performed: Procedure(s): ARTHROSCOPY RIGHT ELBOW WITH EXTENSIVE DEBRIDEMENT (Right)  Patient Location: PACU  Anesthesia Type:General  Level of Consciousness: awake and sedated  Airway & Oxygen Therapy: Patient Spontanous Breathing and Patient connected to face mask oxygen  Post-op Assessment: Report given to RN and Post -op Vital signs reviewed and stable  Post vital signs: Reviewed and stable  Last Vitals:  Filed Vitals:   06/17/15 0700 06/17/15 0701  BP: 141/96   Pulse: 71 76  Temp:    Resp: 11 10    Complications: No apparent anesthesia complications

## 2015-06-17 NOTE — Discharge Instructions (Signed)
Do not remove bandages.  Do not get bandages wet.  May wear sling for comfort.  May apply ice for up to 20 min at a time for pain and swelling.  Follow up appointment in one week.   Regional Anesthesia Blocks  1. Numbness or the inability to move the "blocked" extremity may last from 3-48 hours after placement. The length of time depends on the medication injected and your individual response to the medication. If the numbness is not going away after 48 hours, call your surgeon.  2. The extremity that is blocked will need to be protected until the numbness is gone and the  Strength has returned. Because you cannot feel it, you will need to take extra care to avoid injury. Because it may be weak, you may have difficulty moving it or using it. You may not know what position it is in without looking at it while the block is in effect.  3. For blocks in the legs and feet, returning to weight bearing and walking needs to be done carefully. You will need to wait until the numbness is entirely gone and the strength has returned. You should be able to move your leg and foot normally before you try and bear weight or walk. You will need someone to be with you when you first try to ensure you do not fall and possibly risk injury.  4. Bruising and tenderness at the needle site are common side effects and will resolve in a few days.  5. Persistent numbness or new problems with movement should be communicated to the surgeon or the [facility] [phone] [place] [phone]).    Post Anesthesia Home Care Instructions  Activity: Get plenty of rest for the remainder of the day. A responsible adult should stay with you for 24 hours following the procedure.  For the next 24 hours, DO NOT: -Drive a car -Paediatric nurse -Drink alcoholic beverages -Take any medication unless instructed by your physician -Make any legal decisions or sign important papers.  Meals: Start  with liquid foods such as gelatin or soup. Progress to regular foods as tolerated. Avoid greasy, spicy, heavy foods. If nausea and/or vomiting occur, drink only clear liquids until the nausea and/or vomiting subsides. Call your physician if vomiting continues.  Special Instructions/Symptoms: Your throat may feel dry or sore from the anesthesia or the breathing tube placed in your throat during surgery. If this causes discomfort, gargle with warm salt water. The discomfort should disappear within 24 hours.  If you had a scopolamine patch placed behind your ear for the management of post- operative nausea and/or vomiting:  1. The medication in the patch is effective for 72 hours, after which it should be removed.  Wrap patch in a tissue and discard in the trash. Wash hands thoroughly with soap and water. 2. You may remove the patch earlier than 72 hours if you experience unpleasant side effects which may include dry mouth, dizziness or visual disturbances. 3. Avoid touching the patch. Wash your hands with soap and water after contact with the patch.

## 2015-06-17 NOTE — Anesthesia Postprocedure Evaluation (Signed)
Anesthesia Post Note  Patient: Shawn Roberts  Procedure(s) Performed: Procedure(s) (LRB): ARTHROSCOPY RIGHT ELBOW WITH EXTENSIVE DEBRIDEMENT (Right)  Patient location during evaluation: PACU Anesthesia Type: General Level of consciousness: awake and alert Pain management: pain level controlled Vital Signs Assessment: post-procedure vital signs reviewed and stable Respiratory status: spontaneous breathing, nonlabored ventilation, respiratory function stable and patient connected to nasal cannula oxygen Cardiovascular status: blood pressure returned to baseline and stable Postop Assessment: no signs of nausea or vomiting Anesthetic complications: no    Last Vitals:  Filed Vitals:   06/17/15 0920 06/17/15 0930  BP: 134/80 131/93  Pulse: 74 73  Temp: 36.4 C   Resp: 18 18    Last Pain:  Filed Vitals:   06/17/15 0942  PainSc: 5                  [name]

## 2015-06-17 NOTE — Progress Notes (Signed)
Assisted Dr. Crews with right, ultrasound guided, supraclavicular block. Side rails up, monitors on throughout procedure. See vital signs in flow sheet. Tolerated Procedure well. 

## 2015-06-17 NOTE — Interval H&P Note (Signed)
History and Physical Interval Note:  06/17/2015 7:42 AM  Shawn Roberts  has presented today for surgery, with the diagnosis of Primary osteoarthritis, right elbow  M19.021  The various methods of treatment have been discussed with the patient and family. After consideration of risks, benefits and other options for treatment, the patient has consented to  Procedure(s): ARTHROSCOPY RIGHT ELBOW WITH EXTENSIVE DEBRIDEMENT (Right) as a surgical intervention .  The patient's history has been reviewed, patient examined, no change in status, stable for surgery.  I have reviewed the patient's chart and labs.  Questions were answered to the patient's satisfaction.     [name]

## 2015-06-18 ENCOUNTER — Encounter (HOSPITAL_BASED_OUTPATIENT_CLINIC_OR_DEPARTMENT_OTHER): Payer: Self-pay | Admitting: Orthopedic Surgery

## 2015-06-18 NOTE — Op Note (Signed)
NAME:  Shawn Roberts, Shawn Roberts NO.:  [id number]  MEDICAL RECORD NO.:  [id number]  LOCATION:                                 FACILITY:  PHYSICIAN:  [name], M.D.      DATE OF BIRTH:  DATE OF PROCEDURE:  06/17/2015 DATE OF DISCHARGE:                              OPERATIVE REPORT   PREOPERATIVE DIAGNOSIS:  Right elbow diffuse degenerative arthritis, most marked radial capitellar joint.  Marked posterior spurring, posterior impingement blocking full extension.  POSTOPERATIVE DIAGNOSIS:  Right elbow diffuse degenerative arthritis, most marked radial capitellar joint.  Marked posterior spurring, posterior impingement blocking full extension, with grade III and IV changes, radial capitellar joint.  PROCEDURE:  Right elbow exam under anesthesia, arthroscopy. Chondroplasty debridement synovectomy.  Posterior decompression with removal of olecranon spurs restoring full extension, a dressing posterior impingement.  SURGEON:  [name], M.D.  ASSISTANT:  Elmyra Ricks, PA, present throughout the entire case and necessary for timely completion of procedure.  ANESTHESIA:  General.  BLOOD LOSS:  Minimal.  SPECIMENS:  None.  COMPLICATIONS:  None.  DRESSINGS:  Soft compressive.  TOURNIQUET TIME:  1 hour.  DESCRIPTION OF PROCEDURE:  The patient was brought to operating room and after adequate anesthesia had been obtained, right elbow examined.  Like full extension, a little bit more than 5 degrees.  Full flexion stable ligaments.  Tourniquet applied.  Prepped and draped in usual sterile fashion.  Exsanguinated with elevation of Esmarch.  Tourniquet inflated to 250 mmHg.  Utilizing the fishing pole, I arthroscoped the front of the elbow with anterolateral anteromedial portals, avoiding neurovascular structures.  Arthroscope introduced, elbow distended and inspected.  Reactive synovitis debrided.  Some spurring of the coronoid, which did not block  flexion.  That side of the elbow did not look bad. Only grade II changes.  Lateral side however had grade III and IV changes with exposed bone over much of the radial head on part of the capitellum.  Chondroplasty to a stable surface.  The fishing pole removed.  The problem in across chest position then used 2 posterior portals above the olecranon.  The posterior recess was visualized in a scope.  Debris cleared with a shaver.  Marked spurring of the olecranon permitting extension.  Sequential removal without with shaver and bur. At completion, adequacy of decompression confirmed with full extension and also with fluoroscopic and arthroscopic guidance.  Instruments and fluid removed.  Portals were closed with nylon.  Sterile compressive dressing applied.  Tourniquet and deflated removed.  Anesthesia reversed.  Brought to the recovery room.  Tolerated the surgery well.  No complications.     [name], M.D.     DFM/MEDQ  D:  06/17/2015  T:  06/17/2015  Job:  [id number]

## 2016-06-16 DIAGNOSIS — H1131 Conjunctival hemorrhage, right eye: Secondary | ICD-10-CM | POA: Diagnosis not present

## 2016-06-16 DIAGNOSIS — J019 Acute sinusitis, unspecified: Secondary | ICD-10-CM | POA: Diagnosis not present

## 2016-06-16 DIAGNOSIS — R05 Cough: Secondary | ICD-10-CM | POA: Diagnosis not present

## 2016-07-24 DIAGNOSIS — M25562 Pain in left knee: Secondary | ICD-10-CM | POA: Diagnosis not present

## 2016-07-25 DIAGNOSIS — Z0000 Encounter for general adult medical examination without abnormal findings: Secondary | ICD-10-CM | POA: Diagnosis not present

## 2016-07-29 DIAGNOSIS — M25562 Pain in left knee: Secondary | ICD-10-CM | POA: Diagnosis not present

## 2016-08-03 DIAGNOSIS — S83242A Other tear of medial meniscus, current injury, left knee, initial encounter: Secondary | ICD-10-CM | POA: Diagnosis not present

## 2016-08-03 DIAGNOSIS — M1712 Unilateral primary osteoarthritis, left knee: Secondary | ICD-10-CM | POA: Diagnosis not present

## 2016-08-03 DIAGNOSIS — M94262 Chondromalacia, left knee: Secondary | ICD-10-CM | POA: Diagnosis not present

## 2016-08-22 DIAGNOSIS — Z0001 Encounter for general adult medical examination with abnormal findings: Secondary | ICD-10-CM | POA: Diagnosis not present

## 2016-08-24 DIAGNOSIS — G8918 Other acute postprocedural pain: Secondary | ICD-10-CM | POA: Diagnosis not present

## 2016-08-24 DIAGNOSIS — M94262 Chondromalacia, left knee: Secondary | ICD-10-CM | POA: Diagnosis not present

## 2016-08-24 DIAGNOSIS — M23352 Other meniscus derangements, posterior horn of lateral meniscus, left knee: Secondary | ICD-10-CM | POA: Diagnosis not present

## 2016-08-24 DIAGNOSIS — M23262 Derangement of other lateral meniscus due to old tear or injury, left knee: Secondary | ICD-10-CM | POA: Diagnosis not present

## 2016-08-24 DIAGNOSIS — M23322 Other meniscus derangements, posterior horn of medial meniscus, left knee: Secondary | ICD-10-CM | POA: Diagnosis not present

## 2016-08-24 DIAGNOSIS — M23222 Derangement of posterior horn of medial meniscus due to old tear or injury, left knee: Secondary | ICD-10-CM | POA: Diagnosis not present

## 2016-09-04 ENCOUNTER — Encounter: Payer: Self-pay | Admitting: Gastroenterology

## 2016-10-05 DIAGNOSIS — M1712 Unilateral primary osteoarthritis, left knee: Secondary | ICD-10-CM | POA: Diagnosis not present

## 2016-11-24 DIAGNOSIS — M1712 Unilateral primary osteoarthritis, left knee: Secondary | ICD-10-CM | POA: Diagnosis not present

## 2016-12-01 DIAGNOSIS — M1712 Unilateral primary osteoarthritis, left knee: Secondary | ICD-10-CM | POA: Diagnosis not present

## 2016-12-07 DIAGNOSIS — M94262 Chondromalacia, left knee: Secondary | ICD-10-CM | POA: Diagnosis not present

## 2016-12-07 DIAGNOSIS — M1712 Unilateral primary osteoarthritis, left knee: Secondary | ICD-10-CM | POA: Diagnosis not present

## 2016-12-07 DIAGNOSIS — M25561 Pain in right knee: Secondary | ICD-10-CM | POA: Diagnosis not present

## 2016-12-18 ENCOUNTER — Encounter: Payer: Self-pay | Admitting: Gastroenterology

## 2016-12-18 DIAGNOSIS — R7301 Impaired fasting glucose: Secondary | ICD-10-CM | POA: Diagnosis not present

## 2016-12-18 DIAGNOSIS — K219 Gastro-esophageal reflux disease without esophagitis: Secondary | ICD-10-CM | POA: Diagnosis not present

## 2016-12-18 DIAGNOSIS — E782 Mixed hyperlipidemia: Secondary | ICD-10-CM | POA: Diagnosis not present

## 2016-12-18 DIAGNOSIS — I10 Essential (primary) hypertension: Secondary | ICD-10-CM | POA: Diagnosis not present

## 2016-12-18 DIAGNOSIS — Z9189 Other specified personal risk factors, not elsewhere classified: Secondary | ICD-10-CM | POA: Diagnosis not present

## 2016-12-18 DIAGNOSIS — R5383 Other fatigue: Secondary | ICD-10-CM | POA: Diagnosis not present

## 2016-12-20 DIAGNOSIS — M7121 Synovial cyst of popliteal space [Baker], right knee: Secondary | ICD-10-CM | POA: Diagnosis not present

## 2016-12-21 DIAGNOSIS — K219 Gastro-esophageal reflux disease without esophagitis: Secondary | ICD-10-CM | POA: Diagnosis not present

## 2016-12-21 DIAGNOSIS — I10 Essential (primary) hypertension: Secondary | ICD-10-CM | POA: Diagnosis not present

## 2016-12-21 DIAGNOSIS — Z23 Encounter for immunization: Secondary | ICD-10-CM | POA: Diagnosis not present

## 2017-01-24 DIAGNOSIS — G8929 Other chronic pain: Secondary | ICD-10-CM | POA: Diagnosis not present

## 2017-01-24 DIAGNOSIS — M25561 Pain in right knee: Secondary | ICD-10-CM | POA: Diagnosis not present

## 2017-01-24 DIAGNOSIS — M1712 Unilateral primary osteoarthritis, left knee: Secondary | ICD-10-CM | POA: Diagnosis not present

## 2017-01-24 DIAGNOSIS — M94262 Chondromalacia, left knee: Secondary | ICD-10-CM | POA: Diagnosis not present

## 2017-01-24 DIAGNOSIS — M7121 Synovial cyst of popliteal space [Baker], right knee: Secondary | ICD-10-CM | POA: Diagnosis not present

## 2017-01-31 DIAGNOSIS — M25561 Pain in right knee: Secondary | ICD-10-CM | POA: Diagnosis not present

## 2017-01-31 DIAGNOSIS — G8929 Other chronic pain: Secondary | ICD-10-CM | POA: Diagnosis not present

## 2017-02-13 ENCOUNTER — Ambulatory Visit (AMBULATORY_SURGERY_CENTER): Payer: Self-pay | Admitting: *Deleted

## 2017-02-13 ENCOUNTER — Encounter: Payer: Self-pay | Admitting: *Deleted

## 2017-02-13 VITALS — Ht 71.0 in | Wt 239.6 lb

## 2017-02-13 DIAGNOSIS — Z1211 Encounter for screening for malignant neoplasm of colon: Secondary | ICD-10-CM

## 2017-02-13 MED ORDER — NA SULFATE-K SULFATE-MG SULF 17.5-3.13-1.6 GM/177ML PO SOLN: 1.0000 [IU] | Freq: Once | ORAL | 0 refills | Status: AC

## 2017-02-13 NOTE — Progress Notes (Signed)
No egg or soy allergy known to patient  No issues with past sedation with any surgeries  or procedures, no intubation problems  No diet pills per patient No home 02 use per patient  No blood thinners per patient  Pt denies issues with constipation  No A fib or A flutter  EMMI video sent to pt's e mail  

## 2017-02-14 DIAGNOSIS — M170 Bilateral primary osteoarthritis of knee: Secondary | ICD-10-CM | POA: Diagnosis not present

## 2017-02-14 DIAGNOSIS — M25562 Pain in left knee: Secondary | ICD-10-CM | POA: Diagnosis not present

## 2017-02-14 DIAGNOSIS — M25561 Pain in right knee: Secondary | ICD-10-CM | POA: Diagnosis not present

## 2017-02-16 ENCOUNTER — Encounter: Payer: Self-pay | Admitting: Gastroenterology

## 2017-02-27 ENCOUNTER — Encounter: Payer: Self-pay | Admitting: Gastroenterology

## 2017-02-27 ENCOUNTER — Ambulatory Visit (AMBULATORY_SURGERY_CENTER): Payer: 59 | Admitting: Gastroenterology

## 2017-02-27 VITALS — BP 128/71 | HR 61 | Temp 97.5°F | Resp 13 | Ht 71.0 in | Wt 239.0 lb

## 2017-02-27 DIAGNOSIS — Z1211 Encounter for screening for malignant neoplasm of colon: Secondary | ICD-10-CM

## 2017-02-27 DIAGNOSIS — D122 Benign neoplasm of ascending colon: Secondary | ICD-10-CM

## 2017-02-27 DIAGNOSIS — D123 Benign neoplasm of transverse colon: Secondary | ICD-10-CM | POA: Diagnosis not present

## 2017-02-27 DIAGNOSIS — Z1212 Encounter for screening for malignant neoplasm of rectum: Secondary | ICD-10-CM | POA: Diagnosis not present

## 2017-02-27 MED ORDER — SODIUM CHLORIDE 0.9 % IV SOLN: 500.0000 mL | INTRAVENOUS | Status: DC

## 2017-02-27 NOTE — Op Note (Signed)
[facility] Patient Name: Shawn Roberts Procedure Date: 02/27/2017 9:28 AM MRN: [id number] Endoscopist: Ladene Artist , MD Age: 51 Referring MD:  Date of Birth: [birth year] Gender: Male Account #: [id number] Procedure:                Colonoscopy Indications:              Screening for colorectal malignant neoplasm Medicines:                Monitored Anesthesia Care Procedure:                Pre-Anesthesia Assessment:                           - Prior to the procedure, a History and Physical                            was performed, and patient medications and                            allergies were reviewed. The patient's tolerance of                            previous anesthesia was also reviewed. The risks                            and benefits of the procedure and the sedation                            options and risks were discussed with the patient.                            All questions were answered, and informed consent                            was obtained. Prior Anticoagulants: The patient has                            taken no previous anticoagulant or antiplatelet                            agents. ASA Grade Assessment: II - A patient with                            mild systemic disease. After reviewing the risks                            and benefits, the patient was deemed in                            satisfactory condition to undergo the procedure.                           After obtaining informed consent, the colonoscope  was passed under direct vision. Throughout the                            procedure, the patient's blood pressure, pulse, and                            oxygen saturations were monitored continuously. The                            Colonoscope was introduced through the anus and                            advanced to the the cecum, identified by                            appendiceal orifice and  ileocecal valve. The                            ileocecal valve, appendiceal orifice, and rectum                            were photographed. The quality of the bowel                            preparation was good. The colonoscopy was performed                            without difficulty. The patient tolerated the                            procedure well. Scope In: 9:38:19 AM Scope Out: 9:51:31 AM Scope Withdrawal Time: 0 hours 12 minutes 9 seconds  Total Procedure Duration: 0 hours 13 minutes 12 seconds  Findings:                 The perianal and digital rectal examinations were                            normal.                           Two sessile polyps were found in the transverse                            colon and ascending colon. The polyps were 5 to 8                            mm in size. These polyps were removed with a cold                            snare. Resection and retrieval were complete.                           Internal hemorrhoids were found during  retroflexion. The hemorrhoids were small and Grade                            I (internal hemorrhoids that do not prolapse).                           The exam was otherwise without abnormality on                            direct and retroflexion views. Complications:            No immediate complications. Estimated blood loss:                            None. Estimated Blood Loss:     Estimated blood loss: none. Impression:               - Two 5 to 8 mm polyps in the transverse colon and                            in the ascending colon, removed with a cold snare.                            Resected and retrieved.                           - Internal hemorrhoids.                           - The examination was otherwise normal on direct                            and retroflexion views. Recommendation:           - Repeat colonoscopy in 5 years for surveillance if                             polyp(s) are precancerous, otherwise 10 years.                           - Patient has a contact number available for                            emergencies. The signs and symptoms of potential                            delayed complications were discussed with the                            patient. Return to normal activities tomorrow.                            Written discharge instructions were provided to the                            patient.                           -  Resume previous diet.                           - Continue present medications.                           - Await pathology results. Ladene Artist, MD 02/27/2017 9:58:12 AM This report has been signed electronically.

## 2017-02-27 NOTE — Patient Instructions (Signed)
YOU HAD AN ENDOSCOPIC PROCEDURE TODAY AT THE [facility]:   Refer to the procedure report that was given to you for any specific questions about what was found during the examination.  If the procedure report does not answer your questions, please call your gastroenterologist to clarify.  If you requested that your care partner not be given the details of your procedure findings, then the procedure report has been included in a sealed envelope for you to review at your convenience later.  YOU SHOULD EXPECT: Some feelings of bloating in the abdomen. Passage of more gas than usual.  Walking can help get rid of the air that was put into your GI tract during the procedure and reduce the bloating. If you had a lower endoscopy (such as a colonoscopy or flexible sigmoidoscopy) you may notice spotting of blood in your stool or on the toilet paper. If you underwent a bowel prep for your procedure, you may not have a normal bowel movement for a few days.  Please Note:  You might notice some irritation and congestion in your nose or some drainage.  This is from the oxygen used during your procedure.  There is no need for concern and it should clear up in a day or so.  SYMPTOMS TO REPORT IMMEDIATELY:   Following lower endoscopy (colonoscopy or flexible sigmoidoscopy):  Excessive amounts of blood in the stool  Significant tenderness or worsening of abdominal pains  Swelling of the abdomen that is new, acute  Fever of 100F or higher    For urgent or emergent issues, a gastroenterologist can be reached at any hour by calling [phone].   DIET:  We do recommend a small meal at first, but then you may proceed to your regular diet.  Drink plenty of fluids but you should avoid alcoholic beverages for 24 hours.  ACTIVITY:  You should plan to take it easy for the rest of today and you should NOT DRIVE or use heavy machinery until tomorrow (because of the sedation medicines used during the test).     FOLLOW UP: Our staff will call the number listed on your records the next business day following your procedure to check on you and address any questions or concerns that you may have regarding the information given to you following your procedure. If we do not reach you, we will leave a message.  However, if you are feeling well and you are not experiencing any problems, there is no need to return our call.  We will assume that you have returned to your regular daily activities without incident.  If any biopsies were taken you will be contacted by phone or by letter within the next 1-3 weeks.  Please call us at [phone] if you have not heard about the biopsies in 3 weeks.    SIGNATURES/CONFIDENTIALITY: You and/or your care partner have signed paperwork which will be entered into your electronic medical record.  These signatures attest to the fact that that the information above on your After Visit Summary has been reviewed and is understood.  Full responsibility of the confidentiality of this discharge information lies with you and/or your care-partner.   Resume medications. Information given on polyps and hemorrhoids. 

## 2017-02-27 NOTE — Progress Notes (Signed)
Report to PACU, RN, vss, BBS= Clear.  

## 2017-02-27 NOTE — Progress Notes (Signed)
Pt's states no medical or surgical changes since previsit or office visit. 

## 2017-02-27 NOTE — Progress Notes (Signed)
Called to room to assist during endoscopic procedure.  Patient ID and intended procedure confirmed with present staff. Received instructions for my participation in the procedure from the performing physician.  

## 2017-02-28 ENCOUNTER — Telehealth: Payer: Self-pay | Admitting: *Deleted

## 2017-02-28 ENCOUNTER — Telehealth: Payer: Self-pay

## 2017-02-28 NOTE — Telephone Encounter (Signed)
Left message

## 2017-02-28 NOTE — Telephone Encounter (Signed)
  Follow up Call-  Call back number 02/27/2017  Post procedure Call Back phone  # [phone]  Permission to leave phone message Yes  Some recent data might be hidden     Patient questions:  Do you have a fever, pain , or abdominal swelling? No. Pain Score  0 *  Have you tolerated food without any problems? Yes.    Have you been able to return to your normal activities? Yes.    Do you have any questions about your discharge instructions: Diet   No. Medications  No. Follow up visit  No.  Do you have questions or concerns about your Care? No.  Actions: * If pain score is 4 or above: No action needed, pain <4.

## 2017-03-04 ENCOUNTER — Encounter: Payer: Self-pay | Admitting: Gastroenterology

## 2017-04-03 DIAGNOSIS — S134XXA Sprain of ligaments of cervical spine, initial encounter: Secondary | ICD-10-CM | POA: Diagnosis not present

## 2017-04-03 DIAGNOSIS — S338XXA Sprain of other parts of lumbar spine and pelvis, initial encounter: Secondary | ICD-10-CM | POA: Diagnosis not present

## 2017-04-03 DIAGNOSIS — M546 Pain in thoracic spine: Secondary | ICD-10-CM | POA: Diagnosis not present

## 2017-04-16 DIAGNOSIS — M7121 Synovial cyst of popliteal space [Baker], right knee: Secondary | ICD-10-CM | POA: Diagnosis not present

## 2017-06-05 DIAGNOSIS — H6091 Unspecified otitis externa, right ear: Secondary | ICD-10-CM | POA: Diagnosis not present

## 2017-06-05 DIAGNOSIS — R05 Cough: Secondary | ICD-10-CM | POA: Diagnosis not present

## 2017-06-05 DIAGNOSIS — J019 Acute sinusitis, unspecified: Secondary | ICD-10-CM | POA: Diagnosis not present

## 2017-06-07 DIAGNOSIS — K429 Umbilical hernia without obstruction or gangrene: Secondary | ICD-10-CM | POA: Diagnosis not present

## 2017-06-08 DIAGNOSIS — J441 Chronic obstructive pulmonary disease with (acute) exacerbation: Secondary | ICD-10-CM | POA: Diagnosis not present

## 2017-06-21 ENCOUNTER — Other Ambulatory Visit: Payer: Self-pay | Admitting: Physician Assistant

## 2017-06-21 DIAGNOSIS — D485 Neoplasm of uncertain behavior of skin: Secondary | ICD-10-CM | POA: Diagnosis not present

## 2017-06-21 DIAGNOSIS — L570 Actinic keratosis: Secondary | ICD-10-CM | POA: Diagnosis not present

## 2017-06-21 DIAGNOSIS — D229 Melanocytic nevi, unspecified: Secondary | ICD-10-CM | POA: Diagnosis not present

## 2017-06-27 DIAGNOSIS — R05 Cough: Secondary | ICD-10-CM | POA: Diagnosis not present

## 2017-06-27 DIAGNOSIS — J019 Acute sinusitis, unspecified: Secondary | ICD-10-CM | POA: Diagnosis not present

## 2017-07-09 DIAGNOSIS — J441 Chronic obstructive pulmonary disease with (acute) exacerbation: Secondary | ICD-10-CM | POA: Diagnosis not present

## 2017-07-20 DIAGNOSIS — K429 Umbilical hernia without obstruction or gangrene: Secondary | ICD-10-CM | POA: Diagnosis not present

## 2017-07-20 DIAGNOSIS — E785 Hyperlipidemia, unspecified: Secondary | ICD-10-CM | POA: Diagnosis not present

## 2017-07-20 DIAGNOSIS — I10 Essential (primary) hypertension: Secondary | ICD-10-CM | POA: Diagnosis not present

## 2017-08-02 DIAGNOSIS — H608X1 Other otitis externa, right ear: Secondary | ICD-10-CM | POA: Diagnosis not present

## 2017-08-06 DIAGNOSIS — J441 Chronic obstructive pulmonary disease with (acute) exacerbation: Secondary | ICD-10-CM | POA: Diagnosis not present

## 2017-09-06 DIAGNOSIS — J441 Chronic obstructive pulmonary disease with (acute) exacerbation: Secondary | ICD-10-CM | POA: Diagnosis not present

## 2017-10-06 DIAGNOSIS — J441 Chronic obstructive pulmonary disease with (acute) exacerbation: Secondary | ICD-10-CM | POA: Diagnosis not present

## 2017-10-08 ENCOUNTER — Ambulatory Visit (INDEPENDENT_AMBULATORY_CARE_PROVIDER_SITE_OTHER): Payer: 59 | Admitting: Otolaryngology

## 2017-10-08 DIAGNOSIS — J342 Deviated nasal septum: Secondary | ICD-10-CM | POA: Diagnosis not present

## 2017-10-08 DIAGNOSIS — J343 Hypertrophy of nasal turbinates: Secondary | ICD-10-CM

## 2017-10-08 DIAGNOSIS — H608X3 Other otitis externa, bilateral: Secondary | ICD-10-CM | POA: Diagnosis not present

## 2017-10-10 ENCOUNTER — Other Ambulatory Visit (INDEPENDENT_AMBULATORY_CARE_PROVIDER_SITE_OTHER): Payer: Self-pay | Admitting: Otolaryngology

## 2017-10-10 DIAGNOSIS — J329 Chronic sinusitis, unspecified: Secondary | ICD-10-CM

## 2017-10-26 ENCOUNTER — Ambulatory Visit
Admission: RE | Admit: 2017-10-26 | Discharge: 2017-10-26 | Disposition: A | Discharge status: Home / Self Care | Payer: 59 | Source: Ambulatory Visit | Attending: Otolaryngology | Admitting: Otolaryngology

## 2017-10-26 DIAGNOSIS — J329 Chronic sinusitis, unspecified: Secondary | ICD-10-CM

## 2017-10-26 DIAGNOSIS — R51 Headache: Secondary | ICD-10-CM | POA: Diagnosis not present

## 2017-10-29 ENCOUNTER — Ambulatory Visit (INDEPENDENT_AMBULATORY_CARE_PROVIDER_SITE_OTHER): Payer: 59 | Admitting: Otolaryngology

## 2017-10-29 DIAGNOSIS — J342 Deviated nasal septum: Secondary | ICD-10-CM | POA: Diagnosis not present

## 2017-10-29 DIAGNOSIS — J343 Hypertrophy of nasal turbinates: Secondary | ICD-10-CM | POA: Diagnosis not present

## 2017-10-29 DIAGNOSIS — J310 Chronic rhinitis: Secondary | ICD-10-CM

## 2017-11-06 DIAGNOSIS — J441 Chronic obstructive pulmonary disease with (acute) exacerbation: Secondary | ICD-10-CM | POA: Diagnosis not present

## 2017-11-12 ENCOUNTER — Other Ambulatory Visit: Payer: Self-pay | Admitting: Otolaryngology

## 2017-12-06 DIAGNOSIS — J441 Chronic obstructive pulmonary disease with (acute) exacerbation: Secondary | ICD-10-CM | POA: Diagnosis not present

## 2017-12-11 ENCOUNTER — Encounter (HOSPITAL_BASED_OUTPATIENT_CLINIC_OR_DEPARTMENT_OTHER): Payer: Self-pay | Admitting: *Deleted

## 2017-12-11 ENCOUNTER — Other Ambulatory Visit: Payer: Self-pay

## 2017-12-12 ENCOUNTER — Encounter (HOSPITAL_BASED_OUTPATIENT_CLINIC_OR_DEPARTMENT_OTHER)
Admission: RE | Admit: 2017-12-12 | Discharge: 2017-12-12 | Disposition: A | Discharge status: Home / Self Care | Payer: 59 | Source: Ambulatory Visit | Attending: Otolaryngology | Admitting: Otolaryngology

## 2017-12-12 DIAGNOSIS — I10 Essential (primary) hypertension: Secondary | ICD-10-CM | POA: Insufficient documentation

## 2017-12-12 DIAGNOSIS — Z01810 Encounter for preprocedural cardiovascular examination: Secondary | ICD-10-CM | POA: Insufficient documentation

## 2017-12-12 NOTE — Progress Notes (Signed)
EKG  reviewed by Dr. Rose, will proceed with surgery as scheduled.  

## 2017-12-18 ENCOUNTER — Encounter (HOSPITAL_BASED_OUTPATIENT_CLINIC_OR_DEPARTMENT_OTHER)
Admission: RE | Disposition: A | Discharge status: Home / Self Care | Payer: Self-pay | Source: Ambulatory Visit | Attending: Otolaryngology

## 2017-12-18 ENCOUNTER — Ambulatory Visit (HOSPITAL_BASED_OUTPATIENT_CLINIC_OR_DEPARTMENT_OTHER): Payer: 59 | Admitting: Anesthesiology

## 2017-12-18 ENCOUNTER — Other Ambulatory Visit: Payer: Self-pay

## 2017-12-18 ENCOUNTER — Encounter (HOSPITAL_BASED_OUTPATIENT_CLINIC_OR_DEPARTMENT_OTHER): Payer: Self-pay

## 2017-12-18 ENCOUNTER — Ambulatory Visit (HOSPITAL_BASED_OUTPATIENT_CLINIC_OR_DEPARTMENT_OTHER)
Admission: RE | Admit: 2017-12-18 | Discharge: 2017-12-18 | Disposition: A | Discharge status: Home / Self Care | Payer: 59 | Source: Ambulatory Visit | Attending: Otolaryngology | Admitting: Otolaryngology

## 2017-12-18 DIAGNOSIS — Z882 Allergy status to sulfonamides status: Secondary | ICD-10-CM | POA: Insufficient documentation

## 2017-12-18 DIAGNOSIS — M1990 Unspecified osteoarthritis, unspecified site: Secondary | ICD-10-CM | POA: Diagnosis not present

## 2017-12-18 DIAGNOSIS — J343 Hypertrophy of nasal turbinates: Secondary | ICD-10-CM | POA: Insufficient documentation

## 2017-12-18 DIAGNOSIS — Z79899 Other long term (current) drug therapy: Secondary | ICD-10-CM | POA: Diagnosis not present

## 2017-12-18 DIAGNOSIS — J310 Chronic rhinitis: Secondary | ICD-10-CM | POA: Diagnosis not present

## 2017-12-18 DIAGNOSIS — I10 Essential (primary) hypertension: Secondary | ICD-10-CM | POA: Diagnosis not present

## 2017-12-18 DIAGNOSIS — J342 Deviated nasal septum: Secondary | ICD-10-CM | POA: Insufficient documentation

## 2017-12-18 DIAGNOSIS — J3489 Other specified disorders of nose and nasal sinuses: Secondary | ICD-10-CM | POA: Diagnosis not present

## 2017-12-18 HISTORY — PX: NASAL SEPTOPLASTY W/ TURBINOPLASTY: SHX2070

## 2017-12-18 HISTORY — PX: ENDOSCOPIC CONCHA BULLOSA RESECTION: SHX6395

## 2017-12-18 SURGERY — SEPTOPLASTY, NOSE, WITH NASAL TURBINATE REDUCTION
Anesthesia: General | Site: Nose | Laterality: Right

## 2017-12-18 MED ORDER — FENTANYL CITRATE (PF) 100 MCG/2ML IJ SOLN
50.0000 ug | INTRAMUSCULAR | Status: DC | PRN
  Administered 2017-12-18: 100 ug via INTRAVENOUS

## 2017-12-18 MED ORDER — LIDOCAINE HCL (CARDIAC) PF 100 MG/5ML IV SOSY: PREFILLED_SYRINGE | INTRAVENOUS | Status: DC | PRN

## 2017-12-18 MED ORDER — SUCCINYLCHOLINE CHLORIDE 20 MG/ML IJ SOLN: INTRAMUSCULAR | Status: DC | PRN

## 2017-12-18 MED ORDER — CEFAZOLIN SODIUM-DEXTROSE 2-3 GM-%(50ML) IV SOLR
INTRAVENOUS | Status: DC | PRN
  Administered 2017-12-18: 2 g via INTRAVENOUS

## 2017-12-18 MED ORDER — DEXAMETHASONE SODIUM PHOSPHATE 4 MG/ML IJ SOLN
INTRAMUSCULAR | Status: DC | PRN
  Administered 2017-12-18: 10 mg via INTRAVENOUS

## 2017-12-18 MED ORDER — ONDANSETRON HCL 4 MG/2ML IJ SOLN: 4.0000 mg | Freq: Four times a day (QID) | INTRAMUSCULAR | Status: DC | PRN

## 2017-12-18 MED ORDER — FENTANYL CITRATE (PF) 100 MCG/2ML IJ SOLN
INTRAMUSCULAR | Status: AC
  Filled 2017-12-18: qty 2

## 2017-12-18 MED ORDER — EPHEDRINE SULFATE 50 MG/ML IJ SOLN
INTRAMUSCULAR | Status: DC | PRN
  Administered 2017-12-18: 10 mg via INTRAVENOUS
  Administered 2017-12-18: 5 mg via INTRAVENOUS

## 2017-12-18 MED ORDER — PROPOFOL 10 MG/ML IV BOLUS
INTRAVENOUS | Status: AC
  Filled 2017-12-18: qty 20

## 2017-12-18 MED ORDER — PROPOFOL 10 MG/ML IV BOLUS: INTRAVENOUS | Status: DC | PRN

## 2017-12-18 MED ORDER — LACTATED RINGERS IV SOLN
INTRAVENOUS | Status: DC
  Administered 2017-12-18: 08:00:00 via INTRAVENOUS

## 2017-12-18 MED ORDER — PROPOFOL 10 MG/ML IV BOLUS
INTRAVENOUS | Status: DC | PRN
  Administered 2017-12-18: 250 mg via INTRAVENOUS
  Administered 2017-12-18: 50 mg via INTRAVENOUS
  Administered 2017-12-18: 100 mg via INTRAVENOUS

## 2017-12-18 MED ORDER — OXYCODONE HCL 5 MG/5ML PO SOLN: 5.0000 mg | Freq: Once | ORAL | Status: DC | PRN

## 2017-12-18 MED ORDER — HYDROMORPHONE HCL 1 MG/ML IJ SOLN
0.2500 mg | INTRAMUSCULAR | Status: DC | PRN
  Administered 2017-12-18: 0.5 mg via INTRAVENOUS

## 2017-12-18 MED ORDER — LIDOCAINE HCL (CARDIAC) PF 100 MG/5ML IV SOSY
PREFILLED_SYRINGE | INTRAVENOUS | Status: AC
  Filled 2017-12-18: qty 5

## 2017-12-18 MED ORDER — LIDOCAINE-EPINEPHRINE 1 %-1:100000 IJ SOLN
INTRAMUSCULAR | Status: DC | PRN
  Administered 2017-12-18: 3 mL

## 2017-12-18 MED ORDER — SUGAMMADEX SODIUM 200 MG/2ML IV SOLN
INTRAVENOUS | Status: AC
  Filled 2017-12-18: qty 2

## 2017-12-18 MED ORDER — DEXAMETHASONE SODIUM PHOSPHATE 10 MG/ML IJ SOLN
INTRAMUSCULAR | Status: AC
  Filled 2017-12-18: qty 1

## 2017-12-18 MED ORDER — OXYMETAZOLINE HCL 0.05 % NA SOLN
NASAL | Status: DC | PRN
  Administered 2017-12-18: 1 via TOPICAL

## 2017-12-18 MED ORDER — MIDAZOLAM HCL 2 MG/2ML IJ SOLN: 1.0000 mg | INTRAMUSCULAR | Status: DC | PRN

## 2017-12-18 MED ORDER — SUCCINYLCHOLINE CHLORIDE 20 MG/ML IJ SOLN
INTRAMUSCULAR | Status: DC | PRN
  Administered 2017-12-18: 160 mg via INTRAVENOUS

## 2017-12-18 MED ORDER — MUPIROCIN 2 % EX OINT
TOPICAL_OINTMENT | CUTANEOUS | Status: AC
  Filled 2017-12-18: qty 22

## 2017-12-18 MED ORDER — OXYCODONE HCL 5 MG PO TABS: 5.0000 mg | ORAL_TABLET | Freq: Once | ORAL | Status: DC | PRN

## 2017-12-18 MED ORDER — OXYCODONE-ACETAMINOPHEN 5-300 MG PO TABS: 1.0000 | ORAL_TABLET | ORAL | 0 refills | Status: DC | PRN

## 2017-12-18 MED ORDER — HYDROMORPHONE HCL 1 MG/ML IJ SOLN
INTRAMUSCULAR | Status: AC
  Filled 2017-12-18: qty 0.5

## 2017-12-18 MED ORDER — LIDOCAINE HCL (CARDIAC) PF 100 MG/5ML IV SOSY
PREFILLED_SYRINGE | INTRAVENOUS | Status: DC | PRN
  Administered 2017-12-18: 100 mg via INTRAVENOUS

## 2017-12-18 MED ORDER — ROCURONIUM BROMIDE 10 MG/ML (PF) SYRINGE
PREFILLED_SYRINGE | INTRAVENOUS | Status: AC
  Filled 2017-12-18: qty 10

## 2017-12-18 MED ORDER — AMOXICILLIN 875 MG PO TABS: 875.0000 mg | ORAL_TABLET | Freq: Two times a day (BID) | ORAL | 0 refills | Status: AC

## 2017-12-18 MED ORDER — MUPIROCIN 2 % EX OINT
TOPICAL_OINTMENT | CUTANEOUS | Status: DC | PRN
  Administered 2017-12-18: 1 via NASAL

## 2017-12-18 MED ORDER — ONDANSETRON HCL 4 MG/2ML IJ SOLN
INTRAMUSCULAR | Status: DC | PRN
  Administered 2017-12-18: 4 mg via INTRAVENOUS

## 2017-12-18 MED ORDER — SCOPOLAMINE 1 MG/3DAYS TD PT72: 1.0000 | MEDICATED_PATCH | Freq: Once | TRANSDERMAL | Status: DC | PRN

## 2017-12-18 MED ORDER — ONDANSETRON HCL 4 MG/2ML IJ SOLN
INTRAMUSCULAR | Status: AC
  Filled 2017-12-18: qty 2

## 2017-12-18 SURGICAL SUPPLY — 43 items
ATTRACTOMAT 16X20 MAGNETIC DRP (DRAPES) IMPLANT
BLADE TRICUT ROTATE M4 4 5PK (BLADE) IMPLANT
CANISTER SUC SOCK COL 7IN (MISCELLANEOUS) IMPLANT
CANISTER SUCT 1200ML W/VALVE (MISCELLANEOUS) ×3 IMPLANT
COAGULATOR SUCT 8FR VV (MISCELLANEOUS) ×3 IMPLANT
COAGULATOR SUCT SWTCH 10FR 6 (ELECTROSURGICAL) IMPLANT
DECANTER SPIKE VIAL GLASS SM (MISCELLANEOUS) IMPLANT
DRSG NASOPORE 8CM (GAUZE/BANDAGES/DRESSINGS) IMPLANT
DRSG TELFA 3X8 NADH (GAUZE/BANDAGES/DRESSINGS) IMPLANT
ELECT REM PT RETURN 9FT ADLT (ELECTROSURGICAL) ×3
ELECTRODE REM PT RTRN 9FT ADLT (ELECTROSURGICAL) ×2 IMPLANT
GLOVE BIO SURGEON STRL SZ7.5 (GLOVE) ×3 IMPLANT
GLOVE BIOGEL PI IND STRL 7.0 (GLOVE) ×2 IMPLANT
GLOVE BIOGEL PI INDICATOR 7.0 (GLOVE) ×1
GLOVE ECLIPSE 6.5 STRL STRAW (GLOVE) ×3 IMPLANT
GOWN STRL REUS W/ TWL LRG LVL3 (GOWN DISPOSABLE) ×4 IMPLANT
GOWN STRL REUS W/TWL LRG LVL3 (GOWN DISPOSABLE) ×2
HEMOSTAT SURGICEL 2X14 (HEMOSTASIS) IMPLANT
IV NS 500ML (IV SOLUTION)
IV NS 500ML BAXH (IV SOLUTION) IMPLANT
IV SET EXT 30 76VOL 4 MALE LL (IV SETS) IMPLANT
NEEDLE HYPO 25X1 1.5 SAFETY (NEEDLE) ×3 IMPLANT
NEEDLE SPNL 25GX3.5 QUINCKE BL (NEEDLE) IMPLANT
NS IRRIG 1000ML POUR BTL (IV SOLUTION) ×3 IMPLANT
PACK BASIN DAY SURGERY FS (CUSTOM PROCEDURE TRAY) ×3 IMPLANT
PACK ENT DAY SURGERY (CUSTOM PROCEDURE TRAY) ×3 IMPLANT
PACKING NASAL EPIS 4X2.4 XEROG (MISCELLANEOUS) IMPLANT
SLEEVE SCD COMPRESS KNEE MED (MISCELLANEOUS) ×3 IMPLANT
SOLUTION BUTLER CLEAR DIP (MISCELLANEOUS) ×3 IMPLANT
SPLINT NASAL AIRWAY SILICONE (MISCELLANEOUS) ×3 IMPLANT
SPONGE GAUZE 2X2 8PLY STRL LF (GAUZE/BANDAGES/DRESSINGS) ×3 IMPLANT
SPONGE NEURO XRAY DETECT 1X3 (DISPOSABLE) ×3 IMPLANT
SUT CHROMIC 4 0 P 3 18 (SUTURE) ×3 IMPLANT
SUT ETHILON 3 0 PS 1 (SUTURE) IMPLANT
SUT PLAIN 4 0 ~~LOC~~ 1 (SUTURE) ×3 IMPLANT
SUT PROLENE 3 0 PS 2 (SUTURE) ×3 IMPLANT
SUT VIC AB 4-0 P-3 18XBRD (SUTURE) IMPLANT
SUT VIC AB 4-0 P3 18 (SUTURE)
TOWEL GREEN STERILE FF (TOWEL DISPOSABLE) ×6 IMPLANT
TUBE CONNECTING 20X1/4 (TUBING) ×3 IMPLANT
TUBE SALEM SUMP 12R W/ARV (TUBING) IMPLANT
TUBE SALEM SUMP 16 FR W/ARV (TUBING) ×3 IMPLANT
YANKAUER SUCT BULB TIP NO VENT (SUCTIONS) ×3 IMPLANT

## 2017-12-18 NOTE — Anesthesia Preprocedure Evaluation (Signed)
Anesthesia Evaluation  Patient identified by MRN, date of birth, ID band Patient awake    Reviewed: Allergy & Precautions, H&P , NPO status , Patient's Chart, lab work & pertinent test results  Airway Mallampati: II   Neck ROM: full    Dental   Pulmonary neg pulmonary ROS,    breath sounds clear to auscultation       Cardiovascular hypertension, + DOE   Rhythm:regular Rate:Normal     Neuro/Psych    GI/Hepatic GERD  ,  Endo/Other    Renal/GU stones     Musculoskeletal  (+) Arthritis ,   Abdominal   Peds  Hematology   Anesthesia Other Findings   Reproductive/Obstetrics                             Anesthesia Physical Anesthesia Plan  ASA: II  Anesthesia Plan: General   Post-op Pain Management:    Induction: Intravenous  PONV Risk Score and Plan: 2 and Ondansetron, Dexamethasone, Treatment may vary due to age or medical condition and Midazolam  Airway Management Planned: Oral ETT  Additional Equipment:   Intra-op Plan:   Post-operative Plan: Extubation in OR  Informed Consent: I have reviewed the patients History and Physical, chart, labs and discussed the procedure including the risks, benefits and alternatives for the proposed anesthesia with the patient or authorized representative who has indicated his/her understanding and acceptance.     Plan Discussed with: CRNA, Anesthesiologist and Surgeon  Anesthesia Plan Comments:         Anesthesia Quick Evaluation

## 2017-12-18 NOTE — Anesthesia Postprocedure Evaluation (Signed)
Anesthesia Post Note  Patient: RAESHAWN VO  Procedure(s) Performed: NASAL SEPTOPLASTY WITH TURBINATE REDUCTION (Bilateral Nose) RIGHT ENDOSCOPIC CONCHA BULLOSA RESECTION (Right Nose)     Patient location during evaluation: PACU Anesthesia Type: General Level of consciousness: awake and alert Pain management: pain level controlled Vital Signs Assessment: post-procedure vital signs reviewed and stable Respiratory status: spontaneous breathing, nonlabored ventilation, respiratory function stable and patient connected to nasal cannula oxygen Cardiovascular status: blood pressure returned to baseline and stable Postop Assessment: no apparent nausea or vomiting Anesthetic complications: no    Last Vitals:  Vitals:   12/18/17 1115 12/18/17 1145  BP: 133/89 (!) 159/97  Pulse: 92 88  Resp: 12 16  Temp:  36.6 C  SpO2: 95% 95%    Last Pain:  Vitals:   12/18/17 1145  TempSrc:   PainSc: 2                  Dorthia Tout S

## 2017-12-18 NOTE — Transfer of Care (Signed)
Immediate Anesthesia Transfer of Care Note  Patient: Shawn Roberts  Procedure(s) Performed: NASAL SEPTOPLASTY WITH TURBINATE REDUCTION (Bilateral Nose) RIGHT ENDOSCOPIC CONCHA BULLOSA RESECTION (Right Nose)  Patient Location: PACU  Anesthesia Type:General  Level of Consciousness: awake, alert  and oriented  Airway & Oxygen Therapy: Patient Spontanous Breathing and Patient connected to face mask oxygen  Post-op Assessment: Report given to RN and Post -op Vital signs reviewed and stable  Post vital signs: Reviewed and stable  Last Vitals:  Vitals Value Taken Time  BP    Temp    Pulse 101 12/18/2017 10:33 AM  Resp    SpO2 98 % 12/18/2017 10:33 AM  Vitals shown include unvalidated device data.  Last Pain:  Vitals:   12/18/17 0801  TempSrc: Oral         Complications: No apparent anesthesia complications

## 2017-12-18 NOTE — Discharge Instructions (Addendum)

## 2017-12-18 NOTE — Anesthesia Procedure Notes (Signed)
Procedure Name: Intubation Performed by: Verita Lamb, CRNA Pre-anesthesia Checklist: Patient identified, Emergency Drugs available, Suction available, Patient being monitored and Timeout performed Patient Re-evaluated:Patient Re-evaluated prior to induction Oxygen Delivery Method: Circle system utilized Preoxygenation: Pre-oxygenation with 100% oxygen Induction Type: IV induction Ventilation: Two handed mask ventilation required Laryngoscope Size: Miller and 2 Grade View: Grade I Tube type: Oral Tube size: 7.0 mm Number of attempts: 1 Airway Equipment and Method: Stylet Placement Confirmation: ETT inserted through vocal cords under direct vision,  positive ETCO2 and breath sounds checked- equal and bilateral Secured at: 22 cm Tube secured with: Tape Dental Injury: Teeth and Oropharynx as per pre-operative assessment

## 2017-12-18 NOTE — Op Note (Signed)
DATE OF PROCEDURE: 12/18/2017  OPERATIVE REPORT   SURGEON: Leta Baptist, MD   PREOPERATIVE DIAGNOSES:  1. Severe nasal septal deviation.  2. Bilateral inferior turbinate hypertrophy.  3. Chronic nasal obstruction. 4. Right concha bullosa.  POSTOPERATIVE DIAGNOSES:  1. Severe nasal septal deviation.  2. Bilateral inferior turbinate hypertrophy.  3. Chronic nasal obstruction. 4. Right concha bullosa.  PROCEDURE PERFORMED:  1. Septoplasty.  2. Bilateral partial inferior turbinate resection.  3. Endoscopic resection of right conchal bullosa.  ANESTHESIA: General endotracheal tube anesthesia.   COMPLICATIONS: None.   ESTIMATED BLOOD LOSS: 150 mL.   INDICATION FOR PROCEDURE: Shawn Roberts is a 52 y.o. male with a history of chronic nasal obstruction. The patient was  treated with antihistamine, decongestant, steroid nasal spray, and systemic steroids. However, the patient continued to be symptomatic. On examination, the patient was noted to have bilateral severe inferior turbinate hypertrophy and significant nasal septal deviation, causing significant nasal obstruction. His CT scan also showed a right concha bullosa. Based on the above findings, the decision was made for the patient to undergo the above-stated procedures. The risks, benefits, alternatives, and details of the procedures were discussed with the patient. Questions were invited and answered. Informed consent was obtained.   DESCRIPTION OF PROCEDURE: The patient was taken to the operating room and placed supine on the operating table. General endotracheal tube anesthesia was administered by the anesthesiologist. The patient was positioned, and prepped and draped in the standard fashion for nasal surgery. Pledgets soaked with Afrin were placed in both nasal cavities for decongestion. The pledgets were subsequently removed. The above mentioned severe septal deviation was again noted. 1% lidocaine with 1:100,000 epinephrine was injected  onto the nasal septum bilaterally. A hemitransfixion incision was made on the left side. The mucosal flap was carefully elevated on the left side. A cartilaginous incision was made 1 cm superior to the caudal margin of the nasal septum. Mucosal flap was also elevated on the right side in the similar fashion. It should be noted that due to the severe septal deviation, the deviated portion of the cartilaginous and bony septum had to be removed in piecemeal fashion. Once the deviated portions were removed, a straight midline septum was achieved. The septum was then quilted with 4-0 plain gut sutures. The hemitransfixion incision was closed with interrupted 4-0 chromic sutures.   Attention was then focused on the hypertrophied inferior turbinates. The inferior one half of both hypertrophied inferior turbinate was crossclamped with a Kelly clamp. The inferior one half of each inferior turbinate was then resected with a pair of cross cutting scissors. Hemostasis was achieved with a suction cautery device.   Using a 0 scope, the right middle turbinate was examined. It was noted to be enlarged, consistent with a concha bullosa. The inferior one half of the concha bullosa was resected in a piecemeal fashion using Tru-Cut forceps. Hemostasis was achieved using a suction cautery device.  The care of the patient was turned over to the anesthesiologist. The patient was awakened from anesthesia without difficulty. The patient was extubated and transferred to the recovery room in good condition.   OPERATIVE FINDINGS: Severe nasal septal deviation and bilateral inferior turbinate hypertrophy. Right concha bullosa.  SPECIMEN: None.   FOLLOWUP CARE: The patient be discharged home once he is awake and alert. The patient will be placed on Percocet 1 tablets p.o. q.4 hours p.r.n. pain, and amoxicillin 875 mg p.o. b.i.d. for 3 days. The patient will follow up in my office  in approximately 1 week for splint removal.   [name]  Raynelle Bring, MD

## 2017-12-18 NOTE — H&P (Signed)
Cc: Chronic nasal congestion, recurrents sinus infections  HPI: The patient is a 52 year old male who returns today for his follow-up evaluation.  The patient was last seen 3 weeks ago. At that time, he was complaining of chronic nasal obstruction and recurrent sinus infections.  He was treated with multiple courses of antibiotics.  The patient previously underwent allergy testing, and was treated with multiple allergy medications.  The patient returns today complaining of persistent nasal obstruction bilaterally.  He also complains of facial pressure and discomfort.  He denies any purulent drainage or fever. No other ENT, GI, or respiratory issue noted since the last visit.   Exam: General: Communicates without difficulty, well nourished, no acute distress. Head: Normocephalic, no evidence injury, no tenderness, facial buttresses intact without stepoff. Eyes: PERRL, EOMI. No scleral icterus, conjunctivae clear. Neuro: CN II exam reveals vision grossly intact.  No nystagmus at any point of gaze. Ears: Auricles well formed without lesions.  Ear canals are intact without mass or lesion.  No erythema or edema is appreciated.  The TMs are intact without fluid. Nose: External evaluation reveals normal support and skin without lesions.  Dorsum is intact.  Anterior rhinoscopy reveals congested and edematous mucosa over anterior aspect of the inferior turbinates and deviated nasal septum.  No purulence is noted. Middle meatus is not well visualized. Oral:  Oral cavity and oropharynx are intact, symmetric, without erythema or edema.  Mucosa is moist without lesions. Neck: Full range of motion without pain.  There is no significant lymphadenopathy.  No masses palpable.  Thyroid bed within normal limits to palpation.  Parotid glands and submandibular glands equal bilaterally without mass.  Trachea is midline. Neuro:  CN 2-12 grossly intact. Gait normal. Vestibular: No nystagmus at any point of gaze.   Assessment  1.   The patient is noted to have chronic rhinitis with severe nasal septal deviation and bilateral inferior turbinate hypertrophy.  He also has a large right concha bullosa.  2.  The patient's recent CT scan showed no evidence of acute or chronic sinusitis.   Plan: 1.  The physical exam findings and the CT images are extensively reviewed with the patient.  2.  The patient is reassured he has no acute or chronic sinusitis.  His symptoms are likely secondary to his nasal septal deviation, bilateral turbinate hypertrophy, and right concha bullosa.  3.  Based on the above findings, the patient may benefit from undergoing septoplasty, bilateral partial inferior turbinate resection and right concha bullosa resection.  The risks, benefits, and details of the procedures are reviewed.   4.  The patient would like to proceed with the procedures.

## 2017-12-19 ENCOUNTER — Encounter (HOSPITAL_BASED_OUTPATIENT_CLINIC_OR_DEPARTMENT_OTHER): Payer: Self-pay | Admitting: Otolaryngology

## 2017-12-20 ENCOUNTER — Ambulatory Visit (INDEPENDENT_AMBULATORY_CARE_PROVIDER_SITE_OTHER): Payer: 59 | Admitting: Otolaryngology

## 2018-01-06 DIAGNOSIS — J441 Chronic obstructive pulmonary disease with (acute) exacerbation: Secondary | ICD-10-CM | POA: Diagnosis not present

## 2018-01-07 ENCOUNTER — Ambulatory Visit (INDEPENDENT_AMBULATORY_CARE_PROVIDER_SITE_OTHER): Payer: 59 | Admitting: Otolaryngology

## 2018-02-06 DIAGNOSIS — J441 Chronic obstructive pulmonary disease with (acute) exacerbation: Secondary | ICD-10-CM | POA: Diagnosis not present

## 2018-02-11 DIAGNOSIS — M545 Low back pain: Secondary | ICD-10-CM | POA: Diagnosis not present

## 2018-02-11 DIAGNOSIS — L03116 Cellulitis of left lower limb: Secondary | ICD-10-CM | POA: Diagnosis not present

## 2018-02-11 DIAGNOSIS — H6501 Acute serous otitis media, right ear: Secondary | ICD-10-CM | POA: Diagnosis not present

## 2018-03-08 DIAGNOSIS — J441 Chronic obstructive pulmonary disease with (acute) exacerbation: Secondary | ICD-10-CM | POA: Diagnosis not present

## 2018-03-11 DIAGNOSIS — I10 Essential (primary) hypertension: Secondary | ICD-10-CM | POA: Diagnosis not present

## 2018-03-11 DIAGNOSIS — R7301 Impaired fasting glucose: Secondary | ICD-10-CM | POA: Diagnosis not present

## 2018-03-11 DIAGNOSIS — K219 Gastro-esophageal reflux disease without esophagitis: Secondary | ICD-10-CM | POA: Diagnosis not present

## 2018-03-11 DIAGNOSIS — Z7689 Persons encountering health services in other specified circumstances: Secondary | ICD-10-CM | POA: Diagnosis not present

## 2018-03-11 DIAGNOSIS — E782 Mixed hyperlipidemia: Secondary | ICD-10-CM | POA: Diagnosis not present

## 2018-03-14 DIAGNOSIS — Z0001 Encounter for general adult medical examination with abnormal findings: Secondary | ICD-10-CM | POA: Diagnosis not present

## 2018-03-14 DIAGNOSIS — E782 Mixed hyperlipidemia: Secondary | ICD-10-CM | POA: Diagnosis not present

## 2018-03-14 DIAGNOSIS — R7301 Impaired fasting glucose: Secondary | ICD-10-CM | POA: Diagnosis not present

## 2018-03-14 DIAGNOSIS — I10 Essential (primary) hypertension: Secondary | ICD-10-CM | POA: Diagnosis not present

## 2018-03-21 DIAGNOSIS — S134XXA Sprain of ligaments of cervical spine, initial encounter: Secondary | ICD-10-CM | POA: Diagnosis not present

## 2018-03-21 DIAGNOSIS — S338XXA Sprain of other parts of lumbar spine and pelvis, initial encounter: Secondary | ICD-10-CM | POA: Diagnosis not present

## 2018-03-21 DIAGNOSIS — M546 Pain in thoracic spine: Secondary | ICD-10-CM | POA: Diagnosis not present

## 2018-04-08 DIAGNOSIS — J441 Chronic obstructive pulmonary disease with (acute) exacerbation: Secondary | ICD-10-CM | POA: Diagnosis not present

## 2018-04-11 DIAGNOSIS — M25521 Pain in right elbow: Secondary | ICD-10-CM | POA: Diagnosis not present

## 2018-05-08 DIAGNOSIS — J441 Chronic obstructive pulmonary disease with (acute) exacerbation: Secondary | ICD-10-CM | POA: Diagnosis not present

## 2018-05-30 ENCOUNTER — Other Ambulatory Visit: Payer: Self-pay | Admitting: Orthopedic Surgery

## 2018-05-30 DIAGNOSIS — M25521 Pain in right elbow: Secondary | ICD-10-CM

## 2018-06-04 ENCOUNTER — Ambulatory Visit
Admission: RE | Admit: 2018-06-04 | Discharge: 2018-06-04 | Disposition: A | Discharge status: Home / Self Care | Payer: 59 | Source: Ambulatory Visit | Attending: Orthopedic Surgery | Admitting: Orthopedic Surgery

## 2018-06-04 DIAGNOSIS — M25521 Pain in right elbow: Secondary | ICD-10-CM

## 2018-06-08 DIAGNOSIS — J441 Chronic obstructive pulmonary disease with (acute) exacerbation: Secondary | ICD-10-CM | POA: Diagnosis not present

## 2018-06-10 ENCOUNTER — Other Ambulatory Visit: Payer: 59

## 2018-06-12 DIAGNOSIS — I10 Essential (primary) hypertension: Secondary | ICD-10-CM | POA: Diagnosis not present

## 2018-06-12 DIAGNOSIS — R7301 Impaired fasting glucose: Secondary | ICD-10-CM | POA: Diagnosis not present

## 2018-06-12 DIAGNOSIS — E782 Mixed hyperlipidemia: Secondary | ICD-10-CM | POA: Diagnosis not present

## 2018-06-12 DIAGNOSIS — K219 Gastro-esophageal reflux disease without esophagitis: Secondary | ICD-10-CM | POA: Diagnosis not present

## 2018-06-18 DIAGNOSIS — K219 Gastro-esophageal reflux disease without esophagitis: Secondary | ICD-10-CM | POA: Diagnosis not present

## 2018-06-18 DIAGNOSIS — E6609 Other obesity due to excess calories: Secondary | ICD-10-CM | POA: Diagnosis not present

## 2018-06-18 DIAGNOSIS — I10 Essential (primary) hypertension: Secondary | ICD-10-CM | POA: Diagnosis not present

## 2018-07-02 DIAGNOSIS — I10 Essential (primary) hypertension: Secondary | ICD-10-CM | POA: Diagnosis not present

## 2018-07-02 DIAGNOSIS — R7301 Impaired fasting glucose: Secondary | ICD-10-CM | POA: Diagnosis not present

## 2018-07-02 DIAGNOSIS — K219 Gastro-esophageal reflux disease without esophagitis: Secondary | ICD-10-CM | POA: Diagnosis not present

## 2018-07-06 DIAGNOSIS — M25521 Pain in right elbow: Secondary | ICD-10-CM | POA: Diagnosis not present

## 2018-07-15 DIAGNOSIS — M25521 Pain in right elbow: Secondary | ICD-10-CM | POA: Diagnosis not present

## 2018-07-15 DIAGNOSIS — M13821 Other specified arthritis, right elbow: Secondary | ICD-10-CM | POA: Diagnosis not present

## 2018-10-10 DIAGNOSIS — E782 Mixed hyperlipidemia: Secondary | ICD-10-CM | POA: Diagnosis not present

## 2018-10-10 DIAGNOSIS — K219 Gastro-esophageal reflux disease without esophagitis: Secondary | ICD-10-CM | POA: Diagnosis not present

## 2018-10-10 DIAGNOSIS — I10 Essential (primary) hypertension: Secondary | ICD-10-CM | POA: Diagnosis not present

## 2019-06-10 ENCOUNTER — Ambulatory Visit: Payer: 59 | Attending: Internal Medicine

## 2019-06-10 ENCOUNTER — Other Ambulatory Visit: Payer: Self-pay

## 2019-06-10 DIAGNOSIS — Z20822 Contact with and (suspected) exposure to covid-19: Secondary | ICD-10-CM

## 2019-06-11 ENCOUNTER — Telehealth: Payer: Self-pay | Admitting: *Deleted

## 2019-06-11 NOTE — Telephone Encounter (Signed)
Pt called to check status of his covid 19 test result. Not resulted yet. He voiced understanding,

## 2019-06-12 ENCOUNTER — Telehealth: Payer: Self-pay

## 2019-06-12 LAB — NOVEL CORONAVIRUS, NAA: SARS-CoV-2, NAA: NOT DETECTED

## 2019-06-12 NOTE — Telephone Encounter (Signed)
Pt notified of negative COVID-19 results. Understanding verbalized.  Chasta M Hopkins   

## 2019-08-14 ENCOUNTER — Ambulatory Visit: Payer: 59 | Attending: Internal Medicine

## 2019-08-14 DIAGNOSIS — Z23 Encounter for immunization: Secondary | ICD-10-CM

## 2019-08-14 NOTE — Progress Notes (Signed)
   Covid-19 Vaccination Clinic  Name:  ABBA PORCHIA    MRN: [id number] DOB: [birth year]  08/14/2019  Mr. Gerstenberger was observed post Covid-19 immunization for 15 minutes without incident. He was provided with Vaccine Information Sheet and instruction to access the V-Safe system.   Mr. Finer was instructed to call 911 with any severe reactions post vaccine: Marland Kitchen Difficulty breathing  . Swelling of face and throat  . A fast heartbeat  . A bad rash all over body  . Dizziness and weakness   Immunizations Administered    Name Date Dose VIS Date Route   Moderna COVID-19 Vaccine 08/14/2019 12:37 PM 0.5 mL 04/22/2019 Intramuscular   Manufacturer: Moderna   Lot: [id number]   McCrearyVO:[id number]

## 2019-09-11 ENCOUNTER — Ambulatory Visit: Payer: 59 | Attending: Internal Medicine

## 2019-09-11 DIAGNOSIS — Z23 Encounter for immunization: Secondary | ICD-10-CM

## 2019-09-11 NOTE — Progress Notes (Signed)
   Covid-19 Vaccination Clinic  Name:  MUNG HOUGE    MRN: [id number] DOB: 07/17/1965  09/11/2019  Mr. Connally was observed post Covid-19 immunization for 15 minutes without incident. He was provided with Vaccine Information Sheet and instruction to access the V-Safe system.   Mr. Melancon was instructed to call 911 with any severe reactions post vaccine: Marland Kitchen Difficulty breathing  . Swelling of face and throat  . A fast heartbeat  . A bad rash all over body  . Dizziness and weakness   Immunizations Administered    Name Date Dose VIS Date Route   Moderna COVID-19 Vaccine 09/11/2019 12:19 PM 0.5 mL 04/2019 Intramuscular   Manufacturer: Levan Hurst   Lot: [id number]   Lee MontPO:[id number]

## 2020-04-05 IMAGING — CT CT MAXILLOFACIAL W/O CM
3 of 4 series · 13 of 47 positions shown, 15 images · non-contrast
Comparison: None.

CLINICAL DATA: Sinus pain and pressure. Cough. Headache. Hearing
loss right worse than left.

EXAM:
CT MAXILLOFACIAL WITHOUT CONTRAST
TECHNIQUE: Multidetector CT images of the paranasal sinuses were obtained using
the standard protocol without intravenous contrast.

[Series 2: sinus 2.00 hr60 s3 ax · axial · 0.35mm/px · z∈[-748,-594]mm · 7 of 91 slices shown, 9 images]
[im 7/91  brain]
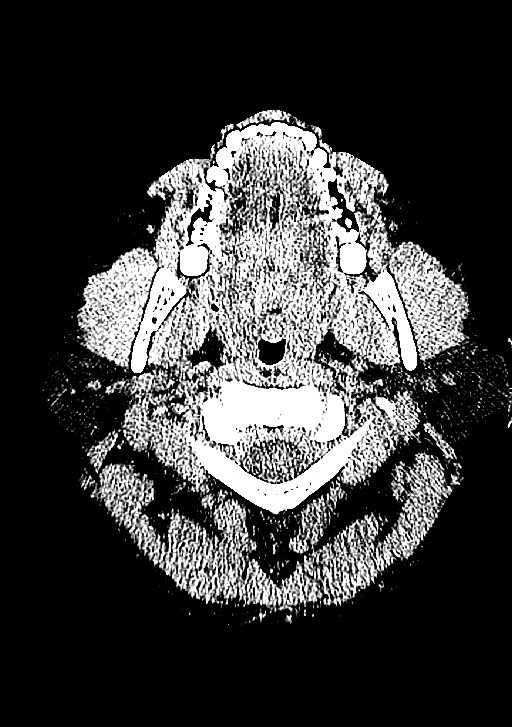
[im 7/91  bone]
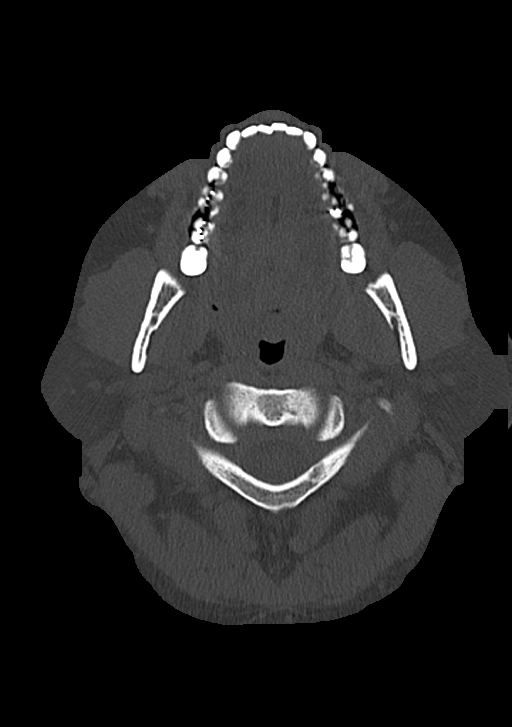
[im 20/91  bone]
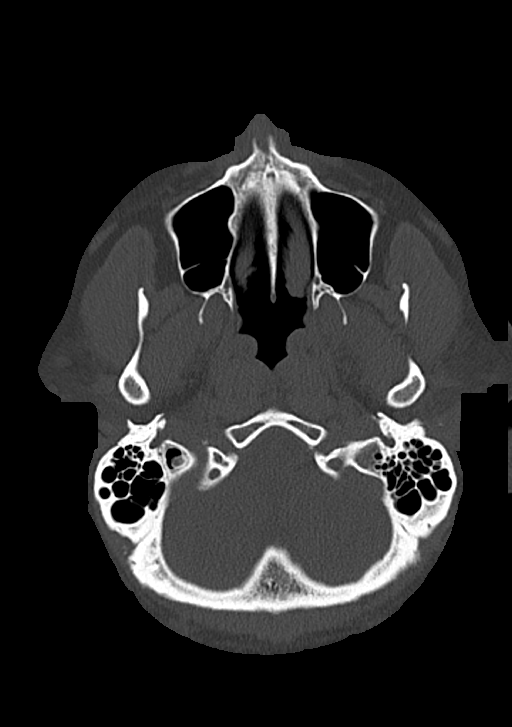
[im 33/91  bone]
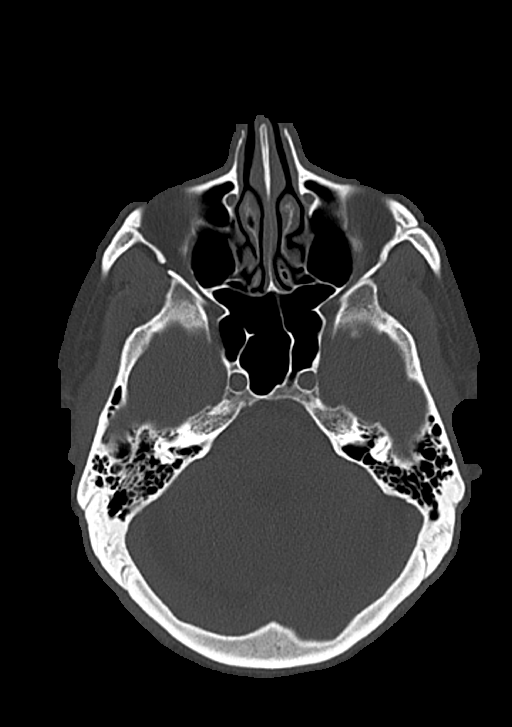
[im 46/91  bone]
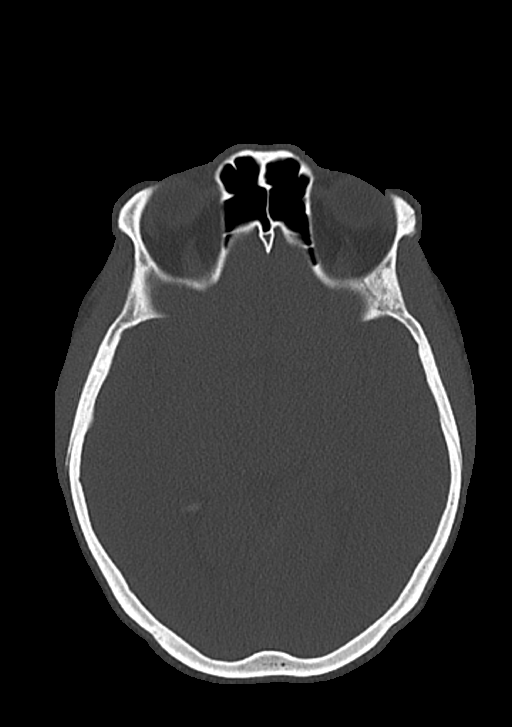
[im 58/91  brain]
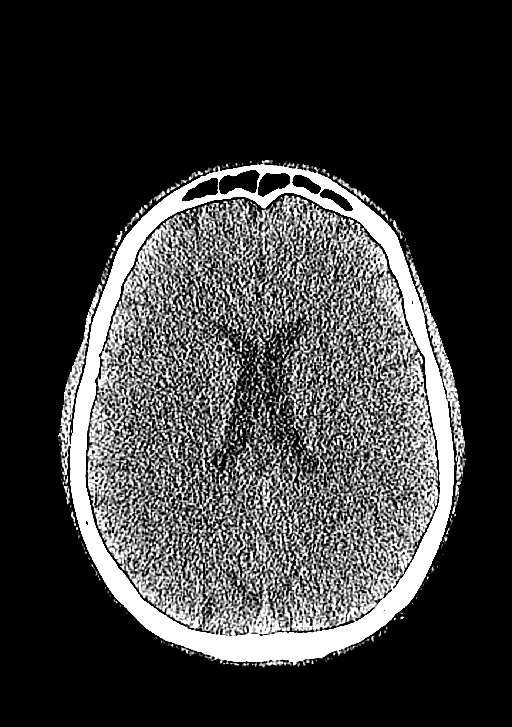
[im 58/91  bone]
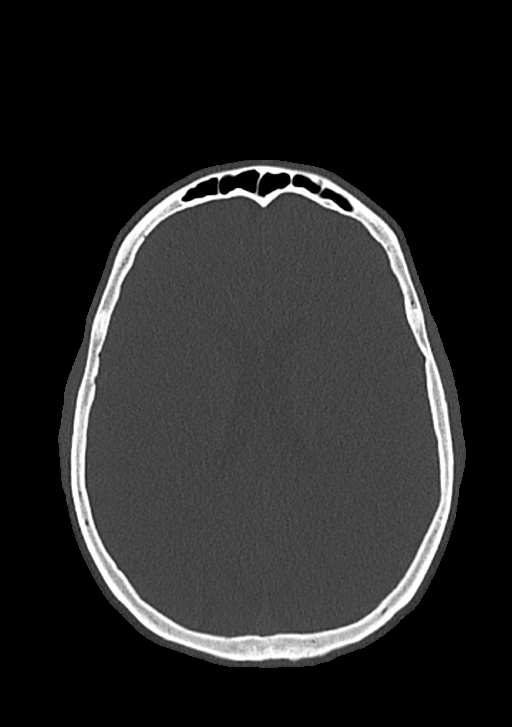
[im 71/91  bone]
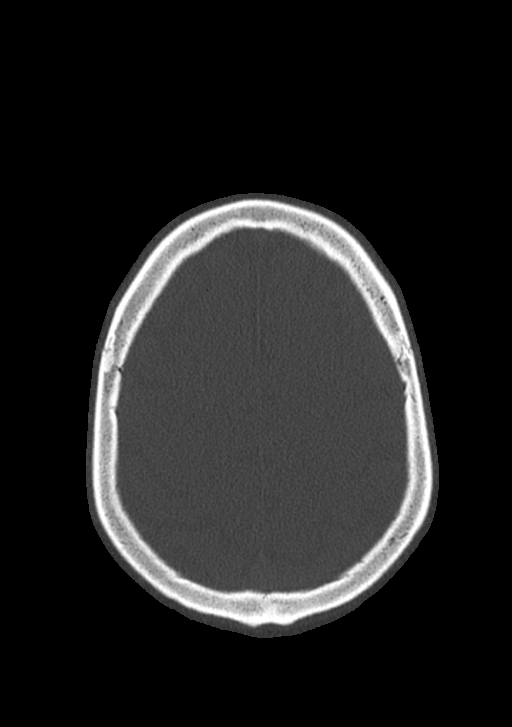
[im 84/91  bone]
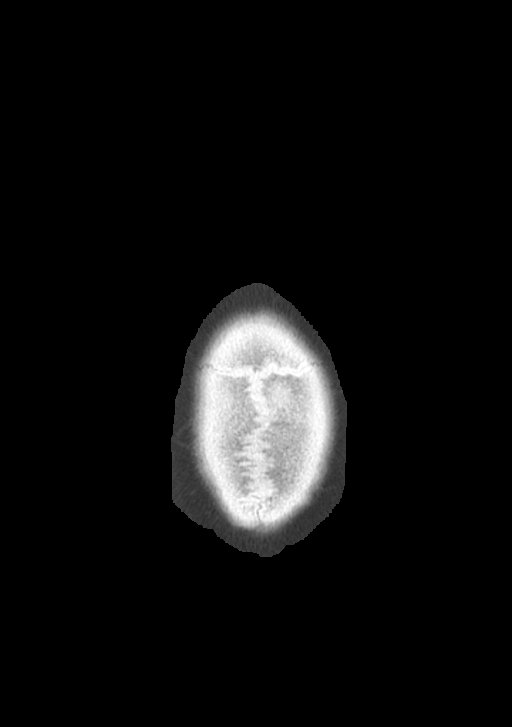

[Series 4: sinus 2.00 hr60 s3 cor · coronal · 0.35mm/px · 3 of 128 slices shown]
[im 43/128  bone]
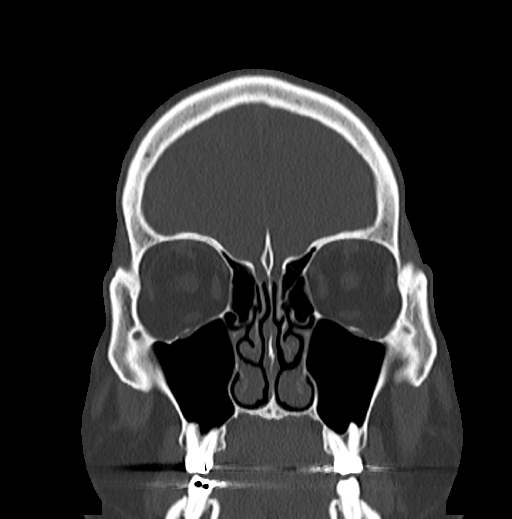
[im 57/128  bone]
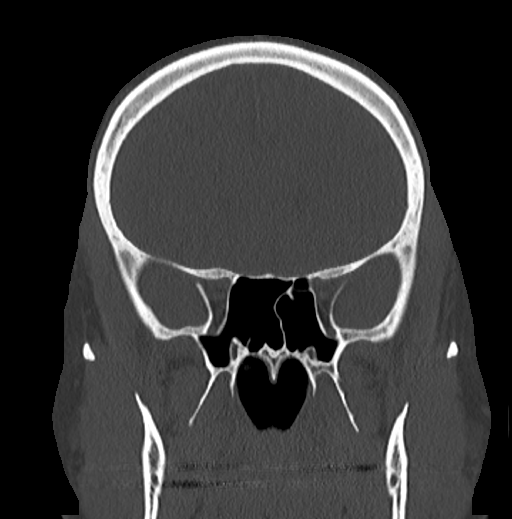
[im 71/128  bone]
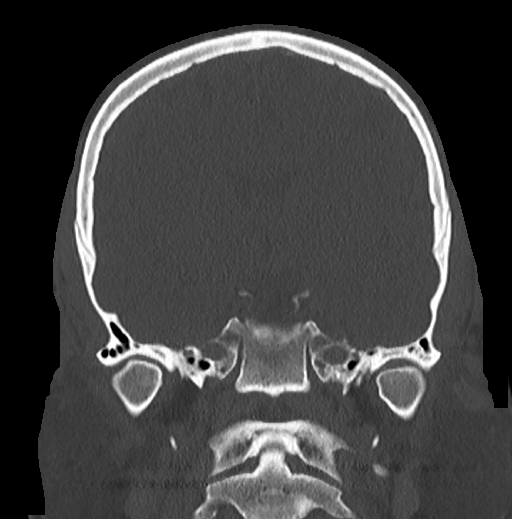

[Series 6: sinus 2.00 hr60 s3 sag · sagittal · 0.36mm/px · 3 of 90 slices shown]
[im 30/90  bone]
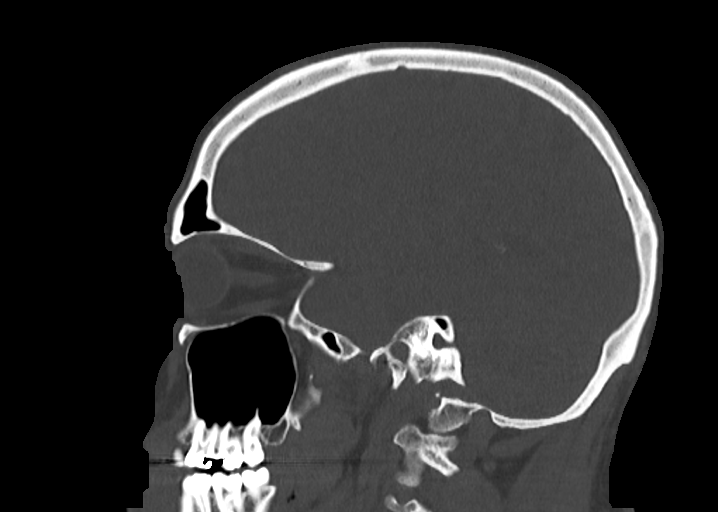
[im 45/90  bone]
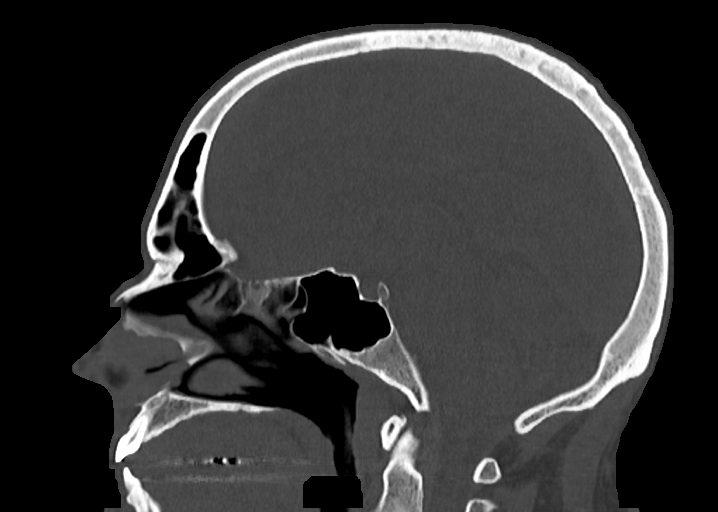
[im 60/90  bone]
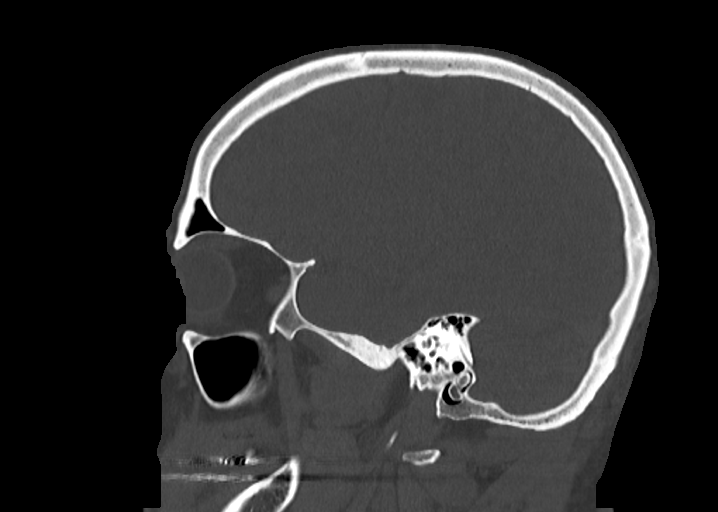

[13 of 47 positions shown; findings below may reference images not displayed]

FINDINGS: Paranasal sinuses:

Frontal: Normally aerated. Patent frontal sinus drainage pathways.

Ethmoid: Normally aerated.

Maxillary: Normally aerated.

Sphenoid: Normally aerated. Patent sphenoethmoidal recesses.

Right ostiomeatal unit: Infundibulum widely patent. No unfavorable
variant.

Left ostiomeatal unit: Infundibulum widely patent. No unfavorable
variant.

Nasal passages: Patent. Nasal septum bows 5 mm towards the right
anteriorly.

Anatomy: Pneumatization superior to the anterior ethmoid notch on
the left. Symmetric and intact olfactory grooves and fovea
ethmoidalis, Keros II (4-7mm) Sellar sphenoid pneumatization
pattern. No dehiscence of carotid or optic canals. No onodi cell.

Other: None significant. Mastoid air cells are clear. No fluid in
the middle ears. No temporal bone abnormality seen. No visible
intracranial abnormality.
IMPRESSION: No evidence of inflammatory sinus disease or other facial region
pathology. No significant anatomic variant.

## 2021-04-11 ENCOUNTER — Other Ambulatory Visit: Payer: Self-pay | Admitting: Neurosurgery

## 2021-04-11 DIAGNOSIS — M48062 Spinal stenosis, lumbar region with neurogenic claudication: Secondary | ICD-10-CM

## 2021-05-01 ENCOUNTER — Ambulatory Visit
Admission: RE | Admit: 2021-05-01 | Discharge: 2021-05-01 | Disposition: A | Discharge status: Home / Self Care | Payer: 59 | Source: Ambulatory Visit | Attending: Neurosurgery | Admitting: Neurosurgery

## 2021-05-01 ENCOUNTER — Other Ambulatory Visit: Payer: Self-pay

## 2021-05-01 DIAGNOSIS — M48062 Spinal stenosis, lumbar region with neurogenic claudication: Secondary | ICD-10-CM

## 2021-05-25 HISTORY — PX: REPLACEMENT TOTAL KNEE: SUR1224

## 2021-06-14 ENCOUNTER — Encounter (HOSPITAL_BASED_OUTPATIENT_CLINIC_OR_DEPARTMENT_OTHER): Payer: Self-pay

## 2021-06-16 ENCOUNTER — Ambulatory Visit (HOSPITAL_BASED_OUTPATIENT_CLINIC_OR_DEPARTMENT_OTHER): Payer: 59 | Admitting: Cardiology

## 2021-06-16 ENCOUNTER — Other Ambulatory Visit: Payer: Self-pay

## 2021-06-16 VITALS — BP 154/80 | Ht 71.0 in | Wt 222.0 lb

## 2021-06-16 DIAGNOSIS — Z79899 Other long term (current) drug therapy: Secondary | ICD-10-CM | POA: Diagnosis not present

## 2021-06-16 DIAGNOSIS — I483 Typical atrial flutter: Secondary | ICD-10-CM | POA: Diagnosis not present

## 2021-06-16 DIAGNOSIS — Z7189 Other specified counseling: Secondary | ICD-10-CM

## 2021-06-16 DIAGNOSIS — I10 Essential (primary) hypertension: Secondary | ICD-10-CM

## 2021-06-16 DIAGNOSIS — E785 Hyperlipidemia, unspecified: Secondary | ICD-10-CM

## 2021-06-16 MED ORDER — VALSARTAN 160 MG PO TABS: 160.0000 mg | ORAL_TABLET | Freq: Every day | ORAL | 3 refills | Status: DC

## 2021-06-16 NOTE — Patient Instructions (Signed)
Medication Instructions:  1) START: Valsartan 160 mg daily  *If you need a refill on your cardiac medications before your next appointment, please call your pharmacy*   Lab Work: Your provider has recommended lab work in 3 weeks (BMP). Please have this collected at [facility] [place]. The lab is open 8:00 am - 4:30 pm. Please avoid 12:00p - 1:00p for lunch hour. You do not need an appointment. Please go to [address] [place], [place] [phone]. This is in the Primary Care office on the 3rd floor, let them know you are there for blood work and they will direct you to the lab.  If you have labs (blood work) drawn today and your tests are completely normal, you will receive your results only by: [place] (if you have MyChart) OR A paper copy in the mail If you have any lab test that is abnormal or we need to change your treatment, we will call you to review the results.   Testing/Procedures: None ordered today   Follow-Up: At [facility], you and your health needs are our priority.  As part of our continuing mission to provide you with exceptional heart care, we have created designated Provider Care Teams.  These Care Teams include your primary Cardiologist (physician) and Advanced Practice Providers (APPs -  Physician Assistants and Nurse Practitioners) who all work together to provide you with the care you need, when you need it.  We recommend signing up for the patient portal called "MyChart".  Sign up information is provided on this After Visit Summary.  MyChart is used to connect with patients for Virtual Visits (Telemedicine).  Patients are able to view lab/test results, encounter notes, upcoming appointments, etc.  Non-urgent messages can be sent to your provider as well.   To learn more about what you can do with MyChart, go to [email or web address].    Your next appointment:   1 year(s)  The format for your next appointment:   In  Person  Provider:   Buford Dresser, MD   Your physician recommends that you schedule a nurse follow-up appointment in 3 weeks for blood pressure check.   [employer]: Website: [email or web address]  DR. Harrell Gave RECOMMENDS YOU PURCHASE  " [employer]" [employer] FROM THE  [employer]  APP PLAY STORE.  THE APP IS FREE , BUT THE  EQUIPMENT HAS A COST. IT ALLOWS YOU TO OBTAIN A RECORDING OF YOUR HEART RATE AND RHYTHM BY PROVIDING A SHORT STRIP THAT YOU CAN SHARE WITH YOUR PROVIDER.

## 2021-06-16 NOTE — Progress Notes (Signed)
Cardiology Office Note:    Date:  06/16/2021   ID:  [name], DOB 1965-11-05, MRN [id number]  PCP:  [facility]  Cardiologist:  Buford Dresser, MD  Referring MD: [facility]   CC: new patient consultation for atrial fibrillation/atrial flutter  History of Present Illness:    Shawn Roberts is a 56 y.o. male with a hx of hypertension, hyperlipidemia, GERD, and obesity who is seen as a new consult at the request of [facility] for the evaluation and management of new onset aflutter.  Notes from 06/08/21 from Dr. Redmond Pulling at [facility] reviewed. Presented with tachycardia, noted regular heart rate at 130 bpm. ECG reviewed, consistent with atrial flutter. Noted concern for atrial fibrillation, prescribed diltiazem ER 180 mg/day and told to continue aspirin 162 mg/day. Labs reviewed, Hgb 17.1, plt 143. Cr 1.30, K 4.6. Referred to cardiology for further evaluation.  Today, he is doing well and accompanied by his wife. He underwent a R knee arthroplasty 2 weeks ago. Since his surgery, he noticed his heart racing and felt poorly. Using his wife's FitBit, he recorded a heart rate of 176 bpm. He reports his rate could stay elevated in the 170s for 3 hours before going back to normal range. He was given metoprolol but it was not effective. He is doing well and tolerating diltiazem.   For the past few months, he notices dizziness after performing activities. The dizziness causes him to pause and rest before restarting the activity.   He was taking valsartan-HCTZ in the past for high blood pressure and tolerated it. He does not check his blood pressure at home. He takes liquid IV with his water.    His father passed away from congestive heart failure at 54 years old. His uncle and cousin have Afib. He has a family history of cancer on his mother's side. He has never smoked. Prior to his surgery, he stayed active. His CHA2DS2-VASc stroke risk  score is 1.   Denies chest pain, shortness of breath at rest or with normal exertion. No PND, orthopnea, LE edema or unexpected weight gain. No syncope. Denies fever, chills, productive cough  Past Medical History:  Diagnosis Date   Allergy    Arthritis    knee, shoulder, elbow   DOE (dyspnea on exertion)    usually with allergies or sinus problems   Environmental allergies    GERD (gastroesophageal reflux disease)    Hyperlipidemia    Hypertension    Kidney stones     Past Surgical History:  Procedure Laterality Date   CARPAL TUNNEL RELEASE     right   ELBOW ARTHROSCOPY Right 06/17/2015   Procedure: ARTHROSCOPY RIGHT ELBOW WITH EXTENSIVE DEBRIDEMENT;  Surgeon: [name], MD;  Location: [place];  Service: Orthopedics;  Laterality: Right;   ENDOSCOPIC CONCHA BULLOSA RESECTION Right 12/18/2017   Procedure: RIGHT ENDOSCOPIC CONCHA BULLOSA RESECTION;  Surgeon: Leta Baptist, MD;  Location: [place];  Service: ENT;  Laterality: Right;   KNEE ARTHROSCOPY     NASAL SEPTOPLASTY W/ TURBINOPLASTY Bilateral 12/18/2017   Procedure: NASAL SEPTOPLASTY WITH TURBINATE REDUCTION;  Surgeon: Leta Baptist, MD;  Location: [place];  Service: ENT;  Laterality: Bilateral;   SHOULDER ARTHROSCOPY WITH DEBRIDEMENT AND BICEP TENDON REPAIR Right 07/15/2013   Procedure: RIGHT SHOULDER ARTHROSCOPY WITH LABERAL DEBRIDMENT   ;  Surgeon: Marin Shutter, MD;  Location: [place];  Service: Orthopedics;  Laterality: Right;  WRIST FRACTURE SURGERY     right hand    Current Medications: Current Outpatient Medications on File Prior to Visit  Medication Sig   albuterol (ACCUNEB) 0.63 MG/3ML nebulizer solution Take 1 ampule by nebulization every 6 (six) hours as needed for wheezing.   aspirin 81 MG chewable tablet aspirin 81 mg chewable tablet  Chew 1 tablet twice a day by oral route for 4 weeks for 30 days.   atorvastatin (LIPITOR) 20 MG tablet Take 20 mg by mouth daily.    celecoxib (CELEBREX) 200 MG capsule Take 1 capsule by mouth in the morning and at bedtime.   diltiazem (CARDIZEM CD) 180 MG 24 hr capsule Take 180 mg by mouth daily.   methocarbamol (ROBAXIN) 500 MG tablet methocarbamol 500 mg tablet  TAKE 1 TABLET BY MOUTH EVERY 6 HOURS AS NEEDED FOR MUSCLE SPAMS   omeprazole (PRILOSEC) 40 MG capsule Take 40 mg by mouth daily. 30-60 minutes before first meal of day   tadalafil (CIALIS) 5 MG tablet Take 5 mg by mouth daily as needed.   tamsulosin (FLOMAX) 0.4 MG CAPS capsule Take 0.4 mg by mouth daily.   testosterone cypionate (DEPOTESTOSTERONE CYPIONATE) 200 MG/ML injection Inject into the muscle every 14 (fourteen) days.   traZODone (DESYREL) 50 MG tablet Take 100 mg by mouth at bedtime as needed.   oxycodone-acetaminophen (LYNOX) 5-300 MG tablet Take 1 tablet by mouth every 4 (four) hours as needed for pain. (Patient not taking: Reported on 06/16/2021)   No current facility-administered medications on file prior to visit.     Allergies:   Sulfonamide derivatives   Social History   Tobacco Use   Smoking status: Never   Smokeless tobacco: Never  Substance Use Topics   Alcohol use: Yes    Comment: occasionally   Drug use: No    Family History: family history includes Breast cancer in his mother; Colon polyps in his father; Emphysema in his maternal grandfather; Heart disease in his father. There is no history of Colon cancer, Esophageal cancer, Rectal cancer, or Stomach cancer.  ROS:   Please see the history of present illness.  Additional pertinent ROS: Constitutional: Negative for chills, fever, night sweats, unintentional weight loss  HENT: Negative for ear pain and hearing loss.   Eyes: Negative for loss of vision and eye pain.  Respiratory: Negative for cough, sputum, wheezing.   Cardiovascular: Positive for heart racing and dizziness Gastrointestinal: Negative for abdominal pain, melena, and hematochezia.  Genitourinary: Negative for  dysuria and hematuria.  Musculoskeletal: Negative for falls and myalgias.  Skin: Negative for itching and rash.  Neurological: Negative for focal weakness, focal sensory changes and loss of consciousness.  Endo/Heme/Allergies: Does bruise/bleed easily.  EKGs/Labs/Other Studies Reviewed:    The following studies were reviewed today: No prior cardiovascular studies  EKG:  EKG is personally reviewed.   06/16/21: NSR at 74, borderline LVH  Recent Labs: No results found for requested labs within last 8760 hours.  Recent Lipid Panel No results found for: CHOL, TRIG, HDL, CHOLHDL, VLDL, LDLCALC, LDLDIRECT  Physical Exam:    VS:  BP (!) 154/80 (BP Location: Right Arm, Patient Position: Sitting, Cuff Size: Large)    Ht 5\' 11"  (1.803 m)    Wt 222 lb (100.7 kg)    BMI 30.96 kg/m     Wt Readings from Last 3 Encounters:  06/16/21 222 lb (100.7 kg)  12/18/17 232 lb 2 oz (105.3 kg)  02/27/17 239 lb (108.4 kg)    GEN:  Well nourished, well developed in no acute distress HEENT: Normal, moist mucous membranes NECK: No JVD CARDIAC: regular rhythm, normal S1 and S2, no rubs or gallops. No murmur. VASCULAR: Radial and DP pulses 2+ bilaterally. No carotid bruits RESPIRATORY:  Clear to auscultation without rales, wheezing or rhonchi  ABDOMEN: Soft, non-tender, non-distended MUSCULOSKELETAL:  Ambulates independently SKIN: Warm and dry, no edema NEUROLOGIC:  Alert and oriented x 3. No focal neuro deficits noted. PSYCHIATRIC:  Normal affect    ASSESSMENT:    1. Typical atrial flutter ([place])   2. Primary hypertension   3. Cardiac risk counseling   4. Counseling on health promotion and disease prevention   5. Medication management   6. Hyperlipidemia, unspecified hyperlipidemia type    PLAN:    Atrial flutter -ECG consistent with typical atrial flutter -he is now back in sinus rhythm. He cannot tell when he was in/out of rhythm specifically, just felt poorly on initial diagnosis -we discussed  anticoagulation. Aspirin alone does not provide stroke protection in atrial flutter. His chadsvasc=1, which does not recommend long term anticoagulation -will monitor for recurrent for now. If recurs, would start anticoagulation in order to pursue either cardioversion or ablation if needed. Would also get echo if recurs -discussed Kardia mobile today to monitor rhythm  Hypertension -elevated today but he was told to hold medications at recent visit give his new arrhythmia -restart valsartan 160 mg daily today -recheck BMET in several weeks  Hyperlipidemia: -last lipids per [facility]: Tchol 140, HDL 48, LDL 77, TG 75 -continue atorvastatin  Post op: can discontinue aspirin when recommended from orthopedics  Cardiac risk counseling and prevention recommendations: -recommend heart healthy/Mediterranean diet, with whole grains, fruits, vegetable, fish, lean meats, nuts, and olive oil. Limit salt. -recommend moderate walking, 3-5 times/week for 30-50 minutes each session. Aim for at least 150 minutes.week. Goal should be pace of 3 miles/hours, or walking 1.5 miles in 30 minutes -recommend avoidance of tobacco products. Avoid excess alcohol. -ASCVD risk score: The ASCVD Risk score (Arnett DK, et al., 2019) failed to calculate for the following reasons:   Cannot find a previous HDL lab   Cannot find a previous total cholesterol lab   The smoking status is invalid    Plan for follow up: 1 year (BP check in 3 weeks)  Buford Dresser, MD, PhD, [facility]    Medication Adjustments/Labs and Tests Ordered: Current medicines are reviewed at length with the patient today.  Concerns regarding medicines are outlined above.  Orders Placed This Encounter  Procedures   Basic metabolic panel   EKG 76-EGBT   Meds ordered this encounter  Medications   valsartan (DIOVAN) 160 MG tablet    Sig: Take 1 tablet (160 mg total) by mouth daily.    Dispense:  90 tablet    Refill:  3     Patient Instructions  Medication Instructions:  1) START: Valsartan 160 mg daily  *If you need a refill on your cardiac medications before your next appointment, please call your pharmacy*   Lab Work: Your provider has recommended lab work in 3 weeks (BMP). Please have this collected at [facility] [place]. The lab is open 8:00 am - 4:30 pm. Please avoid 12:00p - 1:00p for lunch hour. You do not need an appointment. Please go to [address] [place], [place] [address]. This is in the Primary Care office on the 3rd floor, let them know you are there for blood  work and they will direct you to the lab.  If you have labs (blood work) drawn today and your tests are completely normal, you will receive your results only by: [place] (if you have MyChart) OR A paper copy in the mail If you have any lab test that is abnormal or we need to change your treatment, we will call you to review the results.   Testing/Procedures: None ordered today   Follow-Up: At [facility], you and your health needs are our priority.  As part of our continuing mission to provide you with exceptional heart care, we have created designated Provider Care Teams.  These Care Teams include your primary Cardiologist (physician) and Advanced Practice Providers (APPs -  Physician Assistants and Nurse Practitioners) who all work together to provide you with the care you need, when you need it.  We recommend signing up for the patient portal called "MyChart".  Sign up information is provided on this After Visit Summary.  MyChart is used to connect with patients for Virtual Visits (Telemedicine).  Patients are able to view lab/test results, encounter notes, upcoming appointments, etc.  Non-urgent messages can be sent to your provider as well.   To learn more about what you can do with MyChart, go to [email or web address].    Your next appointment:   1 year(s)  The format for your  next appointment:   In Person  Provider:   Buford Dresser, MD   Your physician recommends that you schedule a nurse follow-up appointment in 3 weeks for blood pressure check.   Kardia Mobile [employer]: Website: [email or web address]  DR. Harrell Gave RECOMMENDS YOU PURCHASE  " Kardia" [employer] FROM THE  G[employer]  APP PLAY STORE.  THE APP IS FREE , BUT THE  EQUIPMENT HAS A COST. IT ALLOWS YOU TO OBTAIN A RECORDING OF YOUR HEART RATE AND RHYTHM BY PROVIDING A SHORT STRIP THAT YOU CAN SHARE WITH YOUR PROVIDER.       I,Mykaella Javier,acting as a Education administrator for [employer], MD.,have documented all relevant documentation on the behalf of Buford Dresser, MD,as directed by  Buford Dresser, MD while in the presence of Buford Dresser, MD.  I, Buford Dresser, MD, have reviewed all documentation for this visit. The documentation on 06/17/21 for the exam, diagnosis, procedures, and orders are all accurate and complete.   Signed, Buford Dresser, MD PhD 06/16/2021 3:34 PM    [place]

## 2021-06-17 ENCOUNTER — Encounter (HOSPITAL_BASED_OUTPATIENT_CLINIC_OR_DEPARTMENT_OTHER): Payer: Self-pay | Admitting: Cardiology

## 2021-06-17 ENCOUNTER — Encounter (HOSPITAL_BASED_OUTPATIENT_CLINIC_OR_DEPARTMENT_OTHER): Payer: Self-pay

## 2021-06-30 ENCOUNTER — Encounter (HOSPITAL_BASED_OUTPATIENT_CLINIC_OR_DEPARTMENT_OTHER): Payer: Self-pay

## 2021-06-30 ENCOUNTER — Ambulatory Visit (INDEPENDENT_AMBULATORY_CARE_PROVIDER_SITE_OTHER): Payer: 59

## 2021-06-30 ENCOUNTER — Other Ambulatory Visit: Payer: Self-pay

## 2021-06-30 VITALS — BP 150/86 | HR 79 | Ht 71.0 in | Wt 216.0 lb

## 2021-06-30 DIAGNOSIS — I10 Essential (primary) hypertension: Secondary | ICD-10-CM | POA: Diagnosis not present

## 2021-06-30 NOTE — Progress Notes (Signed)
Reason for visit:   -BP check   2. Name of MD requesting visit:   -Dr. Harrell Gave    3. Assessment and plan per MD:  -BP reviewed by MD.  -Per Dr. Harrell Gave, recommend pt complete blood work (BMP) and will determine next course of action based on results.  -Pt verbalized understanding

## 2021-07-01 ENCOUNTER — Other Ambulatory Visit (HOSPITAL_BASED_OUTPATIENT_CLINIC_OR_DEPARTMENT_OTHER): Payer: Self-pay

## 2021-07-01 ENCOUNTER — Encounter (HOSPITAL_BASED_OUTPATIENT_CLINIC_OR_DEPARTMENT_OTHER): Payer: Self-pay

## 2021-07-01 DIAGNOSIS — Z79899 Other long term (current) drug therapy: Secondary | ICD-10-CM

## 2021-07-01 DIAGNOSIS — I10 Essential (primary) hypertension: Secondary | ICD-10-CM

## 2021-07-01 LAB — BASIC METABOLIC PANEL
BUN/Creatinine Ratio: 9 (ref 9–20)
BUN: 12 mg/dL (ref 6–24)
CO2: 23 mmol/L (ref 20–29)
Calcium: 9.4 mg/dL (ref 8.7–10.2)
Chloride: 105 mmol/L (ref 96–106)
Creatinine, Ser: 1.3 mg/dL — ABNORMAL HIGH (ref 0.76–1.27)
Glucose: 82 mg/dL (ref 70–99)
Potassium: 4.4 mmol/L (ref 3.5–5.2)
Sodium: 143 mmol/L (ref 134–144)
eGFR: 65 mL/min/{1.73_m2} (ref >59)

## 2021-07-01 MED ORDER — VALSARTAN 320 MG PO TABS: 320.0000 mg | ORAL_TABLET | Freq: Every day | ORAL | 3 refills | Status: DC

## 2021-07-25 ENCOUNTER — Other Ambulatory Visit: Payer: Self-pay

## 2021-07-25 ENCOUNTER — Ambulatory Visit (HOSPITAL_BASED_OUTPATIENT_CLINIC_OR_DEPARTMENT_OTHER): Payer: 59 | Admitting: Cardiology

## 2021-07-25 ENCOUNTER — Encounter (HOSPITAL_BASED_OUTPATIENT_CLINIC_OR_DEPARTMENT_OTHER): Payer: Self-pay | Admitting: Cardiology

## 2021-07-25 VITALS — BP 120/72 | HR 66 | Ht 71.0 in | Wt 215.5 lb

## 2021-07-25 DIAGNOSIS — I483 Typical atrial flutter: Secondary | ICD-10-CM

## 2021-07-25 DIAGNOSIS — I10 Essential (primary) hypertension: Secondary | ICD-10-CM | POA: Diagnosis not present

## 2021-07-25 DIAGNOSIS — Z7189 Other specified counseling: Secondary | ICD-10-CM

## 2021-07-25 DIAGNOSIS — R002 Palpitations: Secondary | ICD-10-CM

## 2021-07-25 LAB — BASIC METABOLIC PANEL
BUN/Creatinine Ratio: 13 (ref 9–20)
BUN: 18 mg/dL (ref 6–24)
CO2: 20 mmol/L (ref 20–29)
Calcium: 9.5 mg/dL (ref 8.7–10.2)
Chloride: 103 mmol/L (ref 96–106)
Creatinine, Ser: 1.34 mg/dL — ABNORMAL HIGH (ref 0.76–1.27)
Glucose: 99 mg/dL (ref 70–99)
Potassium: 4.3 mmol/L (ref 3.5–5.2)
Sodium: 138 mmol/L (ref 134–144)
eGFR: 63 mL/min/{1.73_m2} (ref >59)

## 2021-07-25 NOTE — Progress Notes (Signed)
Cardiology Office Note:    Date:  07/25/2021   ID:  Shawn Roberts, DOB 1965-07-18, MRN [id number]  PCP:  Shawn Roberts, [facility]  Cardiologist:  Buford Dresser, MD  Referring MD: Shawn Roberts, [facility]   CC: Follow-up for atrial flutter  History of Present Illness:    Shawn Roberts is a 56 y.o. male with a hx of paroxysmal atrial flutter, hypertension, hyperlipidemia, GERD, and obesity who is seen for follow-up. I initially met him 06/17/2021 as a new consult at the request of Shawn Roberts, [facility] for the evaluation and management of new onset aflutter.  Notes from 06/08/21 from Dr. Redmond Pulling at [facility] reviewed. Presented with tachycardia, noted regular heart rate at 130 bpm. ECG reviewed, consistent with atrial flutter. Noted concern for atrial fibrillation, prescribed diltiazem ER 180 mg/day and told to continue aspirin 162 mg/day. Labs reviewed, Hgb 17.1, plt 143. Cr 1.30, K 4.6. Referred to cardiology for further evaluation.  Today: Overall, he appears well.  Since his last visit he had 1 known arrhythmic episode, and 4 episodes where his smart watch noted "unclassified" with heart rates under 100 bpm. The typical duration of these episodes were less than a few hours. Lately he has experienced episodes once a week on average. During an episode he notices palpitations and shortness of breath.  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  He denies any chest pain, or peripheral edema. No lightheadedness, headaches, syncope, orthopnea, or PND.   Past Medical History:  Diagnosis Date   Allergy    Arthritis    knee, shoulder, elbow   DOE (dyspnea on exertion)    usually with allergies or sinus problems   Environmental allergies    GERD (gastroesophageal reflux disease)    Hyperlipidemia    Hypertension    Kidney stones     Past Surgical History:  Procedure Laterality  Date   CARPAL TUNNEL RELEASE     right   ELBOW ARTHROSCOPY Right 06/17/2015   Procedure: ARTHROSCOPY RIGHT ELBOW WITH EXTENSIVE DEBRIDEMENT;  Surgeon: [name], MD;  Location: [place];  Service: Orthopedics;  Laterality: Right;   ENDOSCOPIC CONCHA BULLOSA RESECTION Right 12/18/2017   Procedure: RIGHT ENDOSCOPIC CONCHA BULLOSA RESECTION;  Surgeon: Leta Baptist, MD;  Location: [place];  Service: ENT;  Laterality: Right;   KNEE ARTHROSCOPY     NASAL SEPTOPLASTY W/ TURBINOPLASTY Bilateral 12/18/2017   Procedure: NASAL SEPTOPLASTY WITH TURBINATE REDUCTION;  Surgeon: Leta Baptist, MD;  Location: [place];  Service: ENT;  Laterality: Bilateral;   SHOULDER ARTHROSCOPY WITH DEBRIDEMENT AND BICEP TENDON REPAIR Right 07/15/2013   Procedure: RIGHT SHOULDER ARTHROSCOPY WITH LABERAL DEBRIDMENT   ;  Surgeon: Marin Shutter, MD;  Location: [place];  Service: Orthopedics;  Laterality: Right;   WRIST FRACTURE SURGERY     right hand    Current Medications: Current Outpatient Medications on File Prior to Visit  Medication Sig   albuterol (ACCUNEB) 0.63 MG/3ML nebulizer solution Take 1 ampule by nebulization every 6 (six) hours as needed for wheezing.   aspirin 81 MG chewable tablet aspirin 81 mg chewable tablet  Chew 1 tablet twice a day by oral route for 4 weeks for 30 days.   atorvastatin (LIPITOR) 20 MG tablet Take 20 mg by mouth daily.   celecoxib (CELEBREX) 200 MG capsule Take 1 capsule by mouth in the morning and at bedtime.  diltiazem (CARDIZEM CD) 180 MG 24 hr capsule Take 180 mg by mouth daily.   methocarbamol (ROBAXIN) 500 MG tablet methocarbamol 500 mg tablet  TAKE 1 TABLET BY MOUTH EVERY 6 HOURS AS NEEDED FOR MUSCLE SPAMS   omeprazole (PRILOSEC) 40 MG capsule Take 40 mg by mouth daily. 30-60 minutes before first meal of day   oxycodone-acetaminophen (LYNOX) 5-300 MG tablet Take 1 tablet by mouth every 4 (four) hours as needed for pain.   tadalafil  (CIALIS) 5 MG tablet Take 5 mg by mouth daily as needed.   tamsulosin (FLOMAX) 0.4 MG CAPS capsule Take 0.4 mg by mouth daily.   testosterone cypionate (DEPOTESTOSTERONE CYPIONATE) 200 MG/ML injection Inject into the muscle every 14 (fourteen) days.   traZODone (DESYREL) 50 MG tablet Take 100 mg by mouth at bedtime as needed.   valsartan (DIOVAN) 320 MG tablet Take 1 tablet (320 mg total) by mouth daily.   No current facility-administered medications on file prior to visit.     Allergies:   Sulfonamide derivatives   Social History   Tobacco Use   Smoking status: Never   Smokeless tobacco: Never  Substance Use Topics   Alcohol use: Yes    Comment: occasionally   Drug use: No    Family History: family history includes Breast cancer in his mother; Colon polyps in his father; Emphysema in his maternal grandfather; Heart disease in his father. There is no history of Colon cancer, Esophageal cancer, Rectal cancer, or Stomach cancer. His father passed away from congestive heart failure at 42 years old. His uncle and cousin have Afib. He has a family history of cancer on his mother's side.   ROS:   Please see the history of present illness. (+) Palpitations (+) Shortness of breath All other systems are reviewed and negative.   EKGs/Labs/Other Studies Reviewed:    The following studies were reviewed today: No prior cardiovascular studies available.  EKG:  EKG is personally reviewed.   07/25/2021: NSR at 66 bpm, borderline LVH 06/16/21: NSR at 74, borderline LVH  Recent Labs: 06/30/2021: BUN 12; Creatinine, Ser 1.30; Potassium 4.4; Sodium 143   Recent Lipid Panel No results found for: CHOL, TRIG, HDL, CHOLHDL, VLDL, LDLCALC, LDLDIRECT  Physical Exam:    VS:  BP 120/72 (BP Location: Left Arm, Patient Position: Sitting, Cuff Size: Large)    Pulse 66    Ht $R'5\' 11"'rt$  (1.803 m)    Wt 215 lb 8 oz (97.8 kg)    BMI 30.06 kg/m     Wt Readings from Last 3 Encounters:  07/25/21 215 lb 8 oz  (97.8 kg)  06/30/21 216 lb (98 kg)  06/16/21 222 lb (100.7 kg)    GEN: Well nourished, well developed in no acute distress HEENT: Normal, moist mucous membranes NECK: No JVD CARDIAC: regular rhythm, normal S1 and S2, no rubs or gallops. No murmur. VASCULAR: Radial and DP pulses 2+ bilaterally. No carotid bruits RESPIRATORY:  Clear to auscultation without rales, wheezing or rhonchi  ABDOMEN: Soft, non-tender, non-distended MUSCULOSKELETAL:  Ambulates independently SKIN: Warm and dry, no edema NEUROLOGIC:  Alert and oriented x 3. No focal neuro deficits noted. PSYCHIATRIC:  Normal affect    ASSESSMENT:    1. Typical atrial flutter ([place])   2. Heart palpitations   3. Primary hypertension   4. Cardiac risk counseling   5. Counseling on health promotion and disease prevention    PLAN:    Atrial flutter -prior ECG consistent with typical atrial flutter -he  has had several brief episodes of palpitations, none longer than a few hours at most -we discussed anticoagulation. Aspirin alone does not provide stroke protection in atrial flutter. His chadsvasc=1, which does not recommend long term anticoagulation. However, if this becomes more frequent/longer lasting, would start anticoagulation and refer to EP for potential ablation -echo ordered -has Kardiamobile at home -using diltiazem  Hypertension -improved to goal today -continue valsartan 160 mg daily  Hyperlipidemia: -last lipids per [facility]: Tchol 140, HDL 48, LDL 77, TG 75 -continue atorvastatin  Post op: can discontinue aspirin when recommended from orthopedics  Cardiac risk counseling and prevention recommendations: -recommend heart healthy/Mediterranean diet, with whole grains, fruits, vegetable, fish, lean meats, nuts, and olive oil. Limit salt. -recommend moderate walking, 3-5 times/week for 30-50 minutes each session. Aim for at least 150 minutes.week. Goal should be pace of 3 miles/hours, or walking 1.5 miles in 30  minutes -recommend avoidance of tobacco products. Avoid excess alcohol. -ASCVD risk score: The ASCVD Risk score (Arnett DK, et al., 2019) failed to calculate for the following reasons:   Cannot find a previous HDL lab   Cannot find a previous total cholesterol lab    Plan for follow up: 3 months.  Buford Dresser, MD, PhD, [facility]    Medication Adjustments/Labs and Tests Ordered: Current medicines are reviewed at length with the patient today.  Concerns regarding medicines are outlined above.   Orders Placed This Encounter  Procedures   EKG 12-Lead   ECHOCARDIOGRAM COMPLETE   No orders of the defined types were placed in this encounter.  Patient Instructions  Medication Instructions:  Your Physician recommend you continue on your current medication as directed.    *If you need a refill on your cardiac medications before your next appointment, please call your pharmacy*   Lab Work: None ordered today   Testing/Procedures: Your physician has requested that you have an echocardiogram August 02, 2021 at 9 am. Echocardiography is a painless test that uses sound waves to create images of your heart. It provides your doctor with information about the size and shape of your heart and how well your hearts chambers and valves are working. This procedure takes approximately one hour. There are no restrictions for this procedure. [place], you and your health needs are our priority.  As part of our continuing mission to provide you with exceptional heart care, we have created designated Provider Care Teams.  These Care Teams include your primary Cardiologist (physician) and Advanced Shawn Roberts Providers (APPs -  Physician Assistants and Nurse Practitioners) who all work together to provide you with the care you need, when you need it.  We recommend signing up for the patient portal called "MyChart".   Sign up information is provided on this After Visit Summary.  MyChart is used to connect with patients for Virtual Visits (Telemedicine).  Patients are able to view lab/test results, encounter notes, upcoming appointments, etc.  Non-urgent messages can be sent to your provider as well.   To learn more about what you can do with MyChart, go to [email or web address].    Your next appointment:   October 26, 2021 @ 8 am  The format for your next appointment:   In Person  Provider:   Buford Dresser, MD{   I,Mathew Stumpf,acting as a scribe for Buford Dresser, MD.,have documented all relevant documentation on the behalf of Buford Dresser, MD,as directed by  Buford Dresser,  MD while in the presence of Buford Dresser, MD.  I, Buford Dresser, MD, have reviewed all documentation for this visit. The documentation on 07/25/21 for the exam, diagnosis, procedures, and orders are all accurate and complete.   Signed, Buford Dresser, MD PhD 07/25/2021 12:41 PM    [place]

## 2021-07-25 NOTE — Patient Instructions (Signed)
Medication Instructions:  ?Your Physician recommend you continue on your current medication as directed.   ? ?*If you need a refill on your cardiac medications before your next appointment, please call your pharmacy* ? ? ?Lab Work: ?None ordered today ? ? ?Testing/Procedures: ?Your physician has requested that you have an echocardiogram August 02, 2021 at 9 am. Echocardiography is a painless test that uses sound waves to create images of your heart. It provides your doctor with information about the size and shape of your heart and how well your heart?s chambers and valves are working. This procedure takes approximately one hour. There are no restrictions for this procedure. ?[place] ? ? ? ?Follow-Up: ?At [facility], you and your health needs are our priority.  As part of our continuing mission to provide you with exceptional heart care, we have created designated Provider Care Teams.  These Care Teams include your primary Cardiologist (physician) and Advanced Practice Providers (APPs -  Physician Assistants and Nurse Practitioners) who all work together to provide you with the care you need, when you need it. ? ?We recommend signing up for the patient portal called "MyChart".  Sign up information is provided on this After Visit Summary.  MyChart is used to connect with patients for Virtual Visits (Telemedicine).  Patients are able to view lab/test results, encounter notes, upcoming appointments, etc.  Non-urgent messages can be sent to your provider as well.   ?To learn more about what you can do with MyChart, go to [email or web address].   ? ?Your next appointment:   ?October 26, 2021 @ 8 am ? ?The format for your next appointment:   ?In Person ? ?Provider:   ?Buford Dresser, MD{ ? ? ?

## 2021-07-26 ENCOUNTER — Encounter (HOSPITAL_BASED_OUTPATIENT_CLINIC_OR_DEPARTMENT_OTHER): Payer: Self-pay | Admitting: Family

## 2021-08-02 ENCOUNTER — Other Ambulatory Visit: Payer: Self-pay

## 2021-08-02 ENCOUNTER — Ambulatory Visit (INDEPENDENT_AMBULATORY_CARE_PROVIDER_SITE_OTHER): Payer: 59

## 2021-08-02 DIAGNOSIS — I483 Typical atrial flutter: Secondary | ICD-10-CM

## 2021-08-02 LAB — ECHOCARDIOGRAM COMPLETE
AR max vel: 2.43 cm2
AV Area VTI: 2.66 cm2
AV Area mean vel: 2.54 cm2
AV Mean grad: 4 mmHg
AV Peak grad: 8.5 mmHg
Ao pk vel: 1.46 m/s
Area-P 1/2: 3.13 cm2
Calc EF: 64.5 %
S' Lateral: 3 cm
Single Plane A2C EF: 61.4 %
Single Plane A4C EF: 65.6 %

## 2021-10-26 ENCOUNTER — Ambulatory Visit (HOSPITAL_BASED_OUTPATIENT_CLINIC_OR_DEPARTMENT_OTHER): Payer: 59 | Admitting: Cardiology

## 2021-10-31 ENCOUNTER — Encounter (HOSPITAL_BASED_OUTPATIENT_CLINIC_OR_DEPARTMENT_OTHER): Payer: Self-pay | Admitting: Cardiology

## 2021-10-31 ENCOUNTER — Ambulatory Visit (HOSPITAL_BASED_OUTPATIENT_CLINIC_OR_DEPARTMENT_OTHER): Payer: 59 | Admitting: Cardiology

## 2021-10-31 VITALS — BP 136/72 | HR 85 | Ht 71.0 in | Wt 226.4 lb

## 2021-10-31 DIAGNOSIS — I10 Essential (primary) hypertension: Secondary | ICD-10-CM | POA: Diagnosis not present

## 2021-10-31 DIAGNOSIS — R002 Palpitations: Secondary | ICD-10-CM | POA: Diagnosis not present

## 2021-10-31 DIAGNOSIS — I483 Typical atrial flutter: Secondary | ICD-10-CM

## 2021-10-31 DIAGNOSIS — Z7189 Other specified counseling: Secondary | ICD-10-CM | POA: Diagnosis not present

## 2021-10-31 NOTE — Progress Notes (Signed)
Cardiology Office Note:    Date:  10/31/2021   ID:  Shawn Roberts, DOB 10-Jun-1965, MRN [id number]  PCP:  [facility]  Cardiologist:  Buford Dresser, MD  Referring MD: [facility]   CC: Follow-up for atrial flutter  History of Present Illness:    Shawn Roberts is a 56 y.o. male with a hx of paroxysmal atrial flutter, hypertension, hyperlipidemia, GERD, and obesity who is seen for follow-up. I initially met him 06/17/2021 as a new consult at the request of [facility] for the evaluation and management of new onset aflutter.  Cardiac history: 05/2021 presented to PCP with HR 130 bpm, found to be in new atrial flutter. Started on diltiazem, told to continue aspirin 162 mg/day, referred to cardiology for further evaluation.   Today, he state that he has been doing well.   He still experiences swelling near his knee but it has overall improved. He also is going back to the gym and has started back working.  He has been having problems with allergies, but no problems with shortness of breath.   His blood pressure  was 119/70 last Saturday morning with his PCP.  He has not had any recurrent fast heart beats or palpitations. We reviewed aspirin--was on for post orthopedic surgery, but he is having easy bruising.   The patient denies chest pain, nocturnal dyspnea, orthopnea or peripheral edema.  There have been no palpitations, lightheadedness or syncope.    Past Medical History:  Diagnosis Date   Allergy    Arthritis    knee, shoulder, elbow   DOE (dyspnea on exertion)    usually with allergies or sinus problems   Environmental allergies    GERD (gastroesophageal reflux disease)    Hyperlipidemia    Hypertension    Kidney stones     Past Surgical History:  Procedure Laterality Date   CARPAL TUNNEL RELEASE     right   ELBOW ARTHROSCOPY Right 06/17/2015   Procedure: ARTHROSCOPY RIGHT ELBOW WITH EXTENSIVE DEBRIDEMENT;  Surgeon: [name], MD;  Location: [place];  Service: Orthopedics;  Laterality: Right;   ENDOSCOPIC CONCHA BULLOSA RESECTION Right 12/18/2017   Procedure: RIGHT ENDOSCOPIC CONCHA BULLOSA RESECTION;  Surgeon: Leta Baptist, MD;  Location: [place];  Service: ENT;  Laterality: Right;   KNEE ARTHROSCOPY     NASAL SEPTOPLASTY W/ TURBINOPLASTY Bilateral 12/18/2017   Procedure: NASAL SEPTOPLASTY WITH TURBINATE REDUCTION;  Surgeon: Leta Baptist, MD;  Location: [place];  Service: ENT;  Laterality: Bilateral;   SHOULDER ARTHROSCOPY WITH DEBRIDEMENT AND BICEP TENDON REPAIR Right 07/15/2013   Procedure: RIGHT SHOULDER ARTHROSCOPY WITH LABERAL DEBRIDMENT   ;  Surgeon: Marin Shutter, MD;  Location: [place];  Service: Orthopedics;  Laterality: Right;   WRIST FRACTURE SURGERY     right hand    Current Medications: Current Outpatient Medications on File Prior to Visit  Medication Sig   albuterol (ACCUNEB) 0.63 MG/3ML nebulizer solution Take 1 ampule by nebulization every 6 (six) hours as needed for wheezing.   atorvastatin (LIPITOR) 20 MG tablet Take 20 mg by mouth daily.   celecoxib (CELEBREX) 200 MG capsule Take 1 capsule by mouth in the morning and at bedtime.   diltiazem (CARDIZEM CD) 180 MG 24 hr capsule Take 180 mg by mouth daily.   methocarbamol (ROBAXIN) 500 MG tablet methocarbamol 500 mg tablet  TAKE 1 TABLET BY MOUTH EVERY 6 HOURS AS NEEDED FOR MUSCLE SPAMS  omeprazole (PRILOSEC) 40 MG capsule Take 40 mg by mouth daily. 30-60 minutes before first meal of day   tadalafil (CIALIS) 5 MG tablet Take 5 mg by mouth daily as needed.   tamsulosin (FLOMAX) 0.4 MG CAPS capsule Take 0.4 mg by mouth daily.   testosterone cypionate (DEPOTESTOSTERONE CYPIONATE) 200 MG/ML injection Inject into the muscle every 14 (fourteen) days.   traZODone (DESYREL) 50 MG tablet Take 100 mg by mouth at bedtime as needed.   valsartan (DIOVAN) 320 MG tablet Take 1 tablet (320 mg total) by mouth  daily.   No current facility-administered medications on file prior to visit.     Allergies:   Sulfonamide derivatives   Social History   Tobacco Use   Smoking status: Never   Smokeless tobacco: Never  Substance Use Topics   Alcohol use: Yes    Comment: occasionally   Drug use: No    Family History: family history includes Breast cancer in his mother; Colon polyps in his father; Emphysema in his maternal grandfather; Heart disease in his father. There is no history of Colon cancer, Esophageal cancer, Rectal cancer, or Stomach cancer. His father passed away from congestive heart failure at 23 years old. His uncle and cousin have Afib. He has a family history of cancer on his mother's side.   ROS:   Please see the history of present illness. All other systems are reviewed and negative.   EKGs/Labs/Other Studies Reviewed:    The following studies were reviewed today:  Echo 08/02/21 1. Left ventricular ejection fraction, by estimation, is 60 to 65%. The  left ventricle has normal function. The left ventricle has no regional  wall motion abnormalities. Left ventricular diastolic parameters are  indeterminate.   2. Right ventricular systolic function is normal. The right ventricular  size is normal. There is normal pulmonary artery systolic pressure.   3. Left atrial size was moderately dilated.   4. Right atrial size was mildly dilated.   5. The mitral valve is normal in structure. Trivial mitral valve  regurgitation. No evidence of mitral stenosis.   6. The aortic valve is tricuspid. Aortic valve regurgitation is not  visualized. No aortic stenosis is present.   7. The inferior vena cava is normal in size with greater than 50%  respiratory variability, suggesting right atrial pressure of 3 mmHg.   Comparison(s): No prior Echocardiogram.   EKG:  EKG is personally reviewed.   10/31/2021 EKG: not ordered today 07/25/2021: NSR at 66 bpm, borderline LVH 06/16/21: NSR at 74,  borderline LVH  Recent Labs: 07/25/2021: BUN 18; Creatinine, Ser 1.34; Potassium 4.3; Sodium 138   Recent Lipid Panel No results found for: "CHOL", "TRIG", "HDL", "CHOLHDL", "VLDL", "LDLCALC", "LDLDIRECT"  Physical Exam:    VS:  BP 136/72 (BP Location: Right Arm, Patient Position: Sitting, Cuff Size: Normal)   Pulse 85   Ht $R'5\' 11"'uP$  (1.803 m)   Wt 226 lb 6.4 oz (102.7 kg)   SpO2 95%   BMI 31.58 kg/m     Wt Readings from Last 3 Encounters:  10/31/21 226 lb 6.4 oz (102.7 kg)  07/25/21 215 lb 8 oz (97.8 kg)  06/30/21 216 lb (98 kg)    GEN: Well nourished, well developed in no acute distress HEENT: Normal, moist mucous membranes NECK: No JVD CARDIAC: regular rhythm, normal S1 and S2, no rubs or gallops. No murmur. VASCULAR: Radial and DP pulses 2+ bilaterally. No carotid bruits RESPIRATORY:  Clear to auscultation without rales, wheezing or rhonchi  ABDOMEN: Soft, non-tender, non-distended MUSCULOSKELETAL:  Ambulates independently SKIN: Warm and dry, no edema NEUROLOGIC:  Alert and oriented x 3. No focal neuro deficits noted. PSYCHIATRIC:  Normal affect    ASSESSMENT:    1. Typical atrial flutter ([place])   2. Heart palpitations   3. Primary hypertension   4. Cardiac risk counseling     PLAN:    Atrial flutter -prior ECG consistent with typical atrial flutter -no recent symptoms -chadsvasc=1, which does not recommend long term anticoagulation. However, if this becomes more frequent/longer lasting, would start anticoagulation and refer to EP for potential ablation -echo normal -has Kardiamobile at home -using diltiazem  Hypertension -better on recheck, at goal at recent PCP visit -continue valsartan 160 mg daily  Hyperlipidemia: -last lipids per [facility]: Tchol 140, HDL 48, LDL 77, TG 75 -continue atorvastatin -stopping aspirin, no known CAD  Cardiac risk counseling and prevention recommendations: -recommend heart healthy/Mediterranean diet, with whole grains, fruits,  vegetable, fish, lean meats, nuts, and olive oil. Limit salt. -recommend moderate walking, 3-5 times/week for 30-50 minutes each session. Aim for at least 150 minutes.week. Goal should be pace of 3 miles/hours, or walking 1.5 miles in 30 minutes -recommend avoidance of tobacco products. Avoid excess alcohol. -ASCVD risk score: The ASCVD Risk score (Arnett DK, et al., 2019) failed to calculate for the following reasons:   Cannot find a previous HDL lab   Cannot find a previous total cholesterol lab    Plan for follow up: 1 year.  Buford Dresser, MD, PhD, [facility]    Medication Adjustments/Labs and Tests Ordered: Current medicines are reviewed at length with the patient today.  Concerns regarding medicines are outlined above.   No orders of the defined types were placed in this encounter.  No orders of the defined types were placed in this encounter.  Patient Instructions   Follow-Up: At [facility], you and your health needs are our priority.  As part of our continuing mission to provide you with exceptional heart care, we have created designated Provider Care Teams.  These Care Teams include your primary Cardiologist (physician) and Advanced Practice Providers (APPs -  Physician Assistants and Nurse Practitioners) who all work together to provide you with the care you need, when you need it.  We recommend signing up for the patient portal called "MyChart".  Sign up information is provided on this After Visit Summary.  MyChart is used to connect with patients for Virtual Visits (Telemedicine).  Patients are able to view lab/test results, encounter notes, upcoming appointments, etc.  Non-urgent messages can be sent to your provider as well.   To learn more about what you can do with MyChart, go to [email or web address].    Your next appointment:   12 month(s)  The format for your next appointment:   In Person  Provider:   Buford Dresser, MD       Important Information About Sugar          I,Tinashe Williams,acting as a scribe for Buford Dresser, MD.,have documented all relevant documentation on the behalf of Buford Dresser, MD,as directed by  Buford Dresser, MD while in the presence of Buford Dresser, MD.   I, Buford Dresser, MD, have reviewed all documentation for this visit. The documentation on 10/31/21 for the exam, diagnosis, procedures, and orders are all accurate and complete.

## 2021-10-31 NOTE — Patient Instructions (Signed)
  Follow-Up: At [facility], you and your health needs are our priority.  As part of our continuing mission to provide you with exceptional heart care, we have created designated Provider Care Teams.  These Care Teams include your primary Cardiologist (physician) and Advanced Practice Providers (APPs -  Physician Assistants and Nurse Practitioners) who all work together to provide you with the care you need, when you need it.  We recommend signing up for the patient portal called "MyChart".  Sign up information is provided on this After Visit Summary.  MyChart is used to connect with patients for Virtual Visits (Telemedicine).  Patients are able to view lab/test results, encounter notes, upcoming appointments, etc.  Non-urgent messages can be sent to your provider as well.   To learn more about what you can do with MyChart, go to [email or web address].    Your next appointment:   12 month(s)  The format for your next appointment:   In Person  Provider:   Buford Dresser, MD      Important Information About Sugar

## 2022-05-29 ENCOUNTER — Other Ambulatory Visit (HOSPITAL_BASED_OUTPATIENT_CLINIC_OR_DEPARTMENT_OTHER): Payer: Self-pay | Admitting: Cardiology

## 2022-05-29 DIAGNOSIS — I10 Essential (primary) hypertension: Secondary | ICD-10-CM

## 2022-05-29 NOTE — Telephone Encounter (Signed)
Rx request sent to pharmacy.  

## 2022-06-06 ENCOUNTER — Encounter: Payer: Self-pay | Admitting: Gastroenterology

## 2022-07-03 ENCOUNTER — Encounter: Payer: Self-pay | Admitting: Gastroenterology

## 2022-07-27 ENCOUNTER — Ambulatory Visit (AMBULATORY_SURGERY_CENTER): Payer: 59

## 2022-07-27 VITALS — Ht 71.0 in | Wt 214.0 lb

## 2022-07-27 DIAGNOSIS — Z86010 Personal history of colonic polyps: Secondary | ICD-10-CM

## 2022-07-27 MED ORDER — NA SULFATE-K SULFATE-MG SULF 17.5-3.13-1.6 GM/177ML PO SOLN: 1.0000 | Freq: Once | ORAL | 0 refills | Status: AC

## 2022-07-27 NOTE — Progress Notes (Signed)
No egg or soy allergy known to patient  No issues known to pt with past sedation with any surgeries or procedures Patient denies ever being told they had issues or difficulty with intubation  No FH of Malignant Hyperthermia Pt is not on diet pills Pt is not on  home 02  Pt is not on blood thinners  Pt denies issues with constipation  No A fib or A flutter Have any cardiac testing pending--no Pt instructed to use [email or web address] or GoodRx for a price reduction on prep  Patient's chart reviewed by Shawn Roberts CNRA prior to previsit and patient appropriate for the [facility].  Previsit completed and red dot placed by patient's name on their procedure day (on provider's schedule).    

## 2022-07-28 ENCOUNTER — Encounter: Payer: Self-pay | Admitting: Gastroenterology

## 2022-08-21 ENCOUNTER — Encounter: Payer: 59 | Admitting: Gastroenterology

## 2022-08-25 ENCOUNTER — Encounter: Payer: Self-pay | Admitting: Gastroenterology

## 2022-08-25 ENCOUNTER — Ambulatory Visit (AMBULATORY_SURGERY_CENTER): Payer: 59 | Admitting: Gastroenterology

## 2022-08-25 VITALS — BP 127/87 | HR 77 | Temp 98.9°F | Resp 12 | Ht 71.0 in | Wt 214.0 lb

## 2022-08-25 DIAGNOSIS — Z09 Encounter for follow-up examination after completed treatment for conditions other than malignant neoplasm: Secondary | ICD-10-CM | POA: Diagnosis not present

## 2022-08-25 DIAGNOSIS — D128 Benign neoplasm of rectum: Secondary | ICD-10-CM | POA: Diagnosis not present

## 2022-08-25 DIAGNOSIS — D123 Benign neoplasm of transverse colon: Secondary | ICD-10-CM

## 2022-08-25 DIAGNOSIS — Z86010 Personal history of colonic polyps: Secondary | ICD-10-CM

## 2022-08-25 DIAGNOSIS — D122 Benign neoplasm of ascending colon: Secondary | ICD-10-CM

## 2022-08-25 MED ORDER — SODIUM CHLORIDE 0.9 % IV SOLN
500.0000 mL | Freq: Once | INTRAVENOUS | Status: DC
Start: 2022-08-25 — End: 2022-08-25

## 2022-08-25 NOTE — Patient Instructions (Signed)
-  Handout on polyps, hemorrhoids, diverticulosis and high fiber diet provided -await pathology results -repeat colonoscopy for surveillance recommended. Date to be determined when pathology result become available   -Continue present medications   YOU HAD AN ENDOSCOPIC PROCEDURE TODAY AT THE [facility]:   Refer to the procedure report that was given to you for any specific questions about what was found during the examination.  If the procedure report does not answer your questions, please call your gastroenterologist to clarify.  If you requested that your care partner not be given the details of your procedure findings, then the procedure report has been included in a sealed envelope for you to review at your convenience later.  YOU SHOULD EXPECT: Some feelings of bloating in the abdomen. Passage of more gas than usual.  Walking can help get rid of the air that was put into your GI tract during the procedure and reduce the bloating. If you had a lower endoscopy (such as a colonoscopy or flexible sigmoidoscopy) you may notice spotting of blood in your stool or on the toilet paper. If you underwent a bowel prep for your procedure, you may not have a normal bowel movement for a few days.  Please Note:  You might notice some irritation and congestion in your nose or some drainage.  This is from the oxygen used during your procedure.  There is no need for concern and it should clear up in a day or so.  SYMPTOMS TO REPORT IMMEDIATELY:  Following lower endoscopy (colonoscopy or flexible sigmoidoscopy):  Excessive amounts of blood in the stool  Significant tenderness or worsening of abdominal pains  Swelling of the abdomen that is new, acute  Fever of 100F or higher  For urgent or emergent issues, a gastroenterologist can be reached at any hour by calling [phone]. Do not use MyChart messaging for urgent concerns.    DIET:  We do recommend a small meal at first, but then you may  proceed to your regular diet.  Drink plenty of fluids but you should avoid alcoholic beverages for 24 hours.  ACTIVITY:  You should plan to take it easy for the rest of today and you should NOT DRIVE or use heavy machinery until tomorrow (because of the sedation medicines used during the test).    FOLLOW UP: Our staff will call the number listed on your records the next business day following your procedure.  We will call around 7:15- 8:00 am to check on you and address any questions or concerns that you may have regarding the information given to you following your procedure. If we do not reach you, we will leave a message.     If any biopsies were taken you will be contacted by phone or by letter within the next 1-3 weeks.  Please call us at [phone] if you have not heard about the biopsies in 3 weeks.    SIGNATURES/CONFIDENTIALITY: You and/or your care partner have signed paperwork which will be entered into your electronic medical record.  These signatures attest to the fact that that the information above on your After Visit Summary has been reviewed and is understood.  Full responsibility of the confidentiality of this discharge information lies with you and/or your care-partner.

## 2022-08-25 NOTE — Progress Notes (Signed)
History & Physical  Primary Care Physician:  [facility] Primary Gastroenterologist: Claudette Head, MD  Impression / Plan:  Personal history of adenomatous colon polyps for surveillance colonoscopy.  CHIEF COMPLAINT:   Personal history of colon polyps   HPI: Shawn Roberts is a 57 y.o. male with a personal history of adenomatous colon polyps for surveillance colonoscopy.   Past Medical History:  Diagnosis Date   Allergy    Arthritis    knee, shoulder, elbow   DOE (dyspnea on exertion)    usually with allergies or sinus problems   Environmental allergies    GERD (gastroesophageal reflux disease)    Hyperlipidemia    Hypertension    Kidney stones     Past Surgical History:  Procedure Laterality Date   CARPAL TUNNEL RELEASE     right   ELBOW ARTHROSCOPY Right 06/17/2015   Procedure: ARTHROSCOPY RIGHT ELBOW WITH EXTENSIVE DEBRIDEMENT;  Surgeon: Loreta Ave, MD;  Location: [facility];  Service: Orthopedics;  Laterality: Right;   ENDOSCOPIC CONCHA BULLOSA RESECTION Right 12/18/2017   Procedure: RIGHT ENDOSCOPIC CONCHA BULLOSA RESECTION;  Surgeon: Newman Pies, MD;  Location: [facility];  Service: ENT;  Laterality: Right;   KNEE ARTHROSCOPY     NASAL SEPTOPLASTY W/ TURBINOPLASTY Bilateral 12/18/2017   Procedure: NASAL SEPTOPLASTY WITH TURBINATE REDUCTION;  Surgeon: Newman Pies, MD;  Location: [facility];  Service: ENT;  Laterality: Bilateral;   REPLACEMENT TOTAL KNEE Right 05/25/2021   SHOULDER ARTHROSCOPY WITH DEBRIDEMENT AND BICEP TENDON REPAIR Right 07/15/2013   Procedure: RIGHT SHOULDER ARTHROSCOPY WITH LABERAL DEBRIDMENT   ;  Surgeon: Senaida Lange, MD;  [facility];  Service: Orthopedics;  Laterality: Right;   WRIST FRACTURE SURGERY     right hand    Prior to Admission medications   Medication Sig Start Date End Date Taking? Authorizing Provider  albuterol (VENTOLIN HFA) 108 (90 Base) MCG/ACT inhaler    Yes  [provider]  atorvastatin (LIPITOR) 20 MG tablet Take 20 mg by mouth daily.   Yes [provider]  diltiazem (CARDIZEM CD) 180 MG 24 hr capsule Take 180 mg by mouth daily. 06/08/21  Yes [provider]  omeprazole (PRILOSEC) 40 MG capsule Take 40 mg by mouth daily. 30-60 minutes before first meal of day   Yes [provider]  tamsulosin (FLOMAX) 0.4 MG CAPS capsule Take 0.4 mg by mouth daily. 06/11/21  Yes [provider]  valsartan (DIOVAN) 320 MG tablet TAKE ONE (1) TABLET BY MOUTH EVERY DAY 05/29/22  Yes Jodelle Red, MD  celecoxib (CELEBREX) 200 MG capsule Take 1 capsule by mouth in the morning and at bedtime.    [provider]  methocarbamol (ROBAXIN) 500 MG tablet methocarbamol 500 mg tablet  TAKE 1 TABLET BY MOUTH EVERY 6 HOURS AS NEEDED FOR MUSCLE SPAMS    [provider]  tadalafil (CIALIS) 5 MG tablet Take 5 mg by mouth daily as needed. 05/09/21   [provider]  testosterone cypionate (DEPOTESTOSTERONE CYPIONATE) 200 MG/ML injection Inject into the muscle every 14 (fourteen) days.    [provider]  traZODone (DESYREL) 50 MG tablet Take 100 mg by mouth at bedtime as needed. Patient not taking: Reported on 07/27/2022 06/11/21   [provider]    Current Outpatient Medications  Medication Sig Dispense Refill   albuterol (VENTOLIN HFA) 108 (90 Base) MCG/ACT inhaler      atorvastatin (LIPITOR) 20 MG tablet Take 20  mg by mouth daily.     diltiazem (CARDIZEM CD) 180 MG 24 hr capsule Take 180 mg by mouth daily.     omeprazole (PRILOSEC) 40 MG capsule Take 40 mg by mouth daily. 30-60 minutes before first meal of day     tamsulosin (FLOMAX) 0.4 MG CAPS capsule Take 0.4 mg by mouth daily.     valsartan (DIOVAN) 320 MG tablet TAKE ONE (1) TABLET BY MOUTH EVERY DAY 90 tablet 1   celecoxib (CELEBREX) 200 MG capsule Take 1 capsule by mouth in the morning and at bedtime.     methocarbamol  (ROBAXIN) 500 MG tablet methocarbamol 500 mg tablet  TAKE 1 TABLET BY MOUTH EVERY 6 HOURS AS NEEDED FOR MUSCLE SPAMS     tadalafil (CIALIS) 5 MG tablet Take 5 mg by mouth daily as needed.     testosterone cypionate (DEPOTESTOSTERONE CYPIONATE) 200 MG/ML injection Inject into the muscle every 14 (fourteen) days.     traZODone (DESYREL) 50 MG tablet Take 100 mg by mouth at bedtime as needed. (Patient not taking: Reported on 07/27/2022)     Current Facility-Administered Medications  Medication Dose Route Frequency Provider Last Rate Last Admin   0.9 %  sodium chloride infusion  500 mL Intravenous Once Meryl DareStark, Anayely Constantine T, MD        Allergies as of 08/25/2022 - Review Complete 08/25/2022  Allergen Reaction Noted   Sulfonamide derivatives  02/05/2009    Family History  Problem Relation Age of Onset   Heart disease Father    Colon polyps Father    Breast cancer Mother    Emphysema Maternal Grandfather    Colon cancer Neg Hx    Esophageal cancer Neg Hx    Rectal cancer Neg Hx    Stomach cancer Neg Hx     Social History   Socioeconomic History   Marital status: Married    Spouse name: Not on file   Number of children: Not on file   Years of education: Not on file   Highest education level: Not on file  Occupational History   Occupation: lab tech    Employer: [employer]  Tobacco Use   Smoking status: Never   Smokeless tobacco: Never  Vaping Use   Vaping Use: Never used  Substance and Sexual Activity   Alcohol use: Yes    Comment: occasionally   Drug use: No   Sexual activity: Not on file  Other Topics Concern   Not on file  Social History Narrative   Not on file   Social Determinants of Health   Financial Resource Strain: Not on file  Food Insecurity: Not on file  Transportation Needs: Not on file  Physical Activity: Not on file  Stress: Not on file  Social Connections: Not on file  Intimate Partner Violence: Not on file    Review of Systems:  All systems  reviewed were negative except where noted in HPI.   PE:  General:  Alert, well-developed, in NAD Head:  Normocephalic and atraumatic. Eyes:  Sclera clear, no icterus.   Conjunctiva pink. Ears:  Normal auditory acuity. Mouth:  No deformity or lesions.  Neck:  Supple; no masses. Lungs:  Clear throughout to auscultation.   No wheezes, crackles, or rhonchi.  Heart:  Regular rate and rhythm; no murmurs. Abdomen:  Soft, nondistended, nontender. No masses, hepatomegaly. No palpable masses.  Normal bowel sounds.    Rectal:  Deferred   Msk:  Symmetrical without gross deformities. Extremities:  Without edema.  Neurologic:  Alert and  oriented x 4; grossly normal neurologically. Skin:  Intact without significant lesions or rashes. Psych:  Alert and cooperative. Normal mood and affect.   Venita LickMalcolm T. Russella DarStark  08/25/2022, 8:01 AM See Loretha StaplerAMION, [facility], to contact our on call provider

## 2022-08-25 NOTE — Progress Notes (Signed)
A and O x3. Report to RN. Tolerated MAC anesthesia well. 

## 2022-08-25 NOTE — Progress Notes (Signed)
Pt's states no medical or surgical changes since previsit or office visit. 

## 2022-08-25 NOTE — Progress Notes (Signed)
Called to room to assist during endoscopic procedure.  Patient ID and intended procedure confirmed with present staff. Received instructions for my participation in the procedure from the performing physician.  

## 2022-08-25 NOTE — Op Note (Signed)
[facility] Patient Name: Shawn Roberts Procedure Date: 08/25/2022 7:28 AM MRN: [id number] Endoscopist: Meryl DareMalcolm T Jarrod Mcenery , MD, [phone] Age: [age] Referring MD:  Date of Birth: 1966/03/01 Gender: Male Account #: [id number] Procedure:                Colonoscopy Indications:              Surveillance: Personal history of adenomatous                            polyps on last colonoscopy 5 years ago Medicines:                Monitored Anesthesia Care Procedure:                Pre-Anesthesia Assessment:                           - Prior to the procedure, a History and Physical                            was performed, and patient medications and                            allergies were reviewed. The patient's tolerance of                            previous anesthesia was also reviewed. The risks                            and benefits of the procedure and the sedation                            options and risks were discussed with the patient.                            All questions were answered, and informed consent                            was obtained. Prior Anticoagulants: The patient has                            taken no anticoagulant or antiplatelet agents. ASA                            Grade Assessment: II - A patient with mild systemic                            disease. After reviewing the risks and benefits,                            the patient was deemed in satisfactory condition to                            undergo the procedure.  After obtaining informed consent, the colonoscope                            was passed under direct vision. Throughout the                            procedure, the patient's blood pressure, pulse, and                            oxygen saturations were monitored continuously. The                            Olympus CF-HQ190L ([id number]) Colonoscope was                            introduced through the anus and  advanced to the the                            cecum, identified by appendiceal orifice and                            ileocecal valve. The ileocecal valve, appendiceal                            orifice, and rectum were photographed. The quality                            of the bowel preparation was good. The colonoscopy                            was performed without difficulty. The patient                            tolerated the procedure well. Scope In: 8:07:29 AM Scope Out: 8:21:02 AM Scope Withdrawal Time: 0 hours 12 minutes 17 seconds  Total Procedure Duration: 0 hours 13 minutes 33 seconds  Findings:                 The perianal and digital rectal examinations were                            normal.                           Three sessile polyps were found in the transverse                            colon and ascending colon. The polyps were 6 to 9                            mm in size. These polyps were removed with a cold                            snare. Resection and retrieval were complete.  A 3 mm polyp was found in the distal rectum. The                            polyp was sessile. The polyp was removed with a                            cold biopsy forceps. Resection and retrieval were                            complete.                           A few small-mouthed diverticula were found in the                            left colon. There was no evidence of diverticular                            bleeding.                           Internal hemorrhoids were found during                            retroflexion. The hemorrhoids were small and Grade                            I (internal hemorrhoids that do not prolapse).                           The exam was otherwise without abnormality on                            direct and retroflexion views. Complications:            No immediate complications. Estimated blood loss:                             None. Estimated Blood Loss:     Estimated blood loss: none. Impression:               - Three 6 to 9 mm polyps in the transverse colon                            and in the ascending colon, removed with a cold                            snare. Resected and retrieved.                           - One 3 mm polyp in the distal rectum, removed with                            a cold biopsy forceps. Resected and retrieved.                           -  Mild diverticulosis in the left colon.                           - Internal hemorrhoids.                           - The examination was otherwise normal on direct                            and retroflexion views. Recommendation:           - Repeat colonoscopy after studies are complete for                            surveillance based on pathology results.                           - Patient has a contact number available for                            emergencies. The signs and symptoms of potential                            delayed complications were discussed with the                            patient. Return to normal activities tomorrow.                            Written discharge instructions were provided to the                            patient.                           - High fiber diet.                           - Continue present medications.                           - Await pathology results. Meryl Dare, MD 08/25/2022 8:24:56 AM This report has been signed electronically.

## 2022-08-28 ENCOUNTER — Telehealth: Payer: Self-pay

## 2022-08-28 NOTE — Telephone Encounter (Signed)
  Follow up Call-     08/25/2022    7:27 AM  Call back number  Post procedure Call Back phone  # [phone]  Permission to leave phone message Yes     Left message

## 2022-09-07 ENCOUNTER — Encounter: Payer: Self-pay | Admitting: Gastroenterology

## 2022-12-07 ENCOUNTER — Other Ambulatory Visit (HOSPITAL_BASED_OUTPATIENT_CLINIC_OR_DEPARTMENT_OTHER): Payer: Self-pay | Admitting: Cardiology

## 2022-12-07 DIAGNOSIS — I10 Essential (primary) hypertension: Secondary | ICD-10-CM

## 2023-01-09 ENCOUNTER — Other Ambulatory Visit (HOSPITAL_BASED_OUTPATIENT_CLINIC_OR_DEPARTMENT_OTHER): Payer: Self-pay | Admitting: Cardiology

## 2023-01-09 DIAGNOSIS — I10 Essential (primary) hypertension: Secondary | ICD-10-CM

## 2023-01-09 NOTE — Telephone Encounter (Signed)
Please call pt to schedule overdue 1 year follow-up appointment with Dr. Christopher or APP for refills. Thank you! 

## 2023-01-10 NOTE — Telephone Encounter (Signed)
Left message for patient to call and schedule over due folow up with Dr. Cristal Deer / APP for medication refills

## 2023-01-12 ENCOUNTER — Encounter (HOSPITAL_BASED_OUTPATIENT_CLINIC_OR_DEPARTMENT_OTHER): Payer: Self-pay

## 2023-01-12 NOTE — Telephone Encounter (Signed)
Left message for patient to call and schedule over due follow up with Dr. Christopher / APP for medication refills 

## 2023-01-12 NOTE — Telephone Encounter (Signed)
My Chart message sent

## 2023-01-16 ENCOUNTER — Encounter (HOSPITAL_BASED_OUTPATIENT_CLINIC_OR_DEPARTMENT_OTHER): Payer: Self-pay | Admitting: Cardiology

## 2023-01-16 NOTE — Telephone Encounter (Signed)
Left message for patient to call and discuss scheduling the over due appointment for medication refills---Will also mail letter requesting patient contact the office

## 2023-01-17 NOTE — Telephone Encounter (Signed)
Rx request sent to pharmacy.  

## 2023-01-17 NOTE — Telephone Encounter (Signed)
Scheduled 02/23/23 with Gillian Shields, NP

## 2023-02-23 ENCOUNTER — Ambulatory Visit (HOSPITAL_BASED_OUTPATIENT_CLINIC_OR_DEPARTMENT_OTHER): Payer: 59 | Admitting: Family

## 2023-02-23 NOTE — Progress Notes (Deleted)
Cardiology Office Note:  .   Date:  02/23/2023  ID:  [name], DOB [birth year], MRN [id number] PCP: [facility]  [facility] Providers Cardiologist:  Jodelle Red, MD { Click to update primary MD,subspecialty MD or APP then REFRESH:1}   History of Present Illness: Shawn Roberts is a 57 y.o. male with history of paroxysmal atrial flutter, hypertension, hyperlipidemia, GERD, obesity.  Initially seen 06/17/2021 after presenting to PCP found to be in new onset atrial flutter.  He was started on diltiazem.  Echo 07/2021 LVEF 60-65%, no RWMA, LA moderately dilated, RA mildly dilated, RV normal, no significant valvular abnormalities.  Last seen 10/31/2021 by Dr. Cristal Deer. CHA2DS2-VASc of 1, OAC deferred.  No recent palpitations.    ?  Sleep study  ROS: Please see the history of present illness.    All other systems reviewed and are negative.   Studies Reviewed: .        Cardiac Studies & Procedures       ECHOCARDIOGRAM  ECHOCARDIOGRAM COMPLETE 08/02/2021  Narrative ECHOCARDIOGRAM REPORT    Patient Name:   Shawn Roberts Date of Exam: 08/02/2021 Medical Rec #:  [id number]     Height:       71.0 in Accession #:    [id number]    Weight:       215.5 lb Date of Birth:  1965-07-26     BSA:          2.177 m Patient Age:    55 years      BP:           120/72 mmHg Patient Gender: M             HR:           73 bpm. Exam Location:  Outpatient  Procedure: 2D Echo, Cardiac Doppler and Color Doppler  Indications:    I48.4 Atypical atrial flutter  History:        Patient has no prior history of Echocardiogram examinations. Arrythmias:Atrial Flutter, Signs/Symptoms:Dyspnea; Risk Factors:Hypertension, Dyslipidemia and Non-Smoker. Patient denies chest pain and leg edema. He does have some DOE.  Sonographer:    Carlos American RVT, RDCS (AE), RDMS Referring Phys: [phone] BRIDGETTE CHRISTOPHER  IMPRESSIONS   1. Left ventricular ejection fraction, by  estimation, is 60 to 65%. The left ventricle has normal function. The left ventricle has no regional wall motion abnormalities. Left ventricular diastolic parameters are indeterminate. 2. Right ventricular systolic function is normal. The right ventricular size is normal. There is normal pulmonary artery systolic pressure. 3. Left atrial size was moderately dilated. 4. Right atrial size was mildly dilated. 5. The mitral valve is normal in structure. Trivial mitral valve regurgitation. No evidence of mitral stenosis. 6. The aortic valve is tricuspid. Aortic valve regurgitation is not visualized. No aortic stenosis is present. 7. The inferior vena cava is normal in size with greater than 50% respiratory variability, suggesting right atrial pressure of 3 mmHg.  Comparison(s): No prior Echocardiogram.  Conclusion(s)/Recommendation(s): Normal biventricular function without evidence of hemodynamically significant valvular heart disease.  FINDINGS Left Ventricle: Left ventricular ejection fraction, by estimation, is 60 to 65%. The left ventricle has normal function. The left ventricle has no regional wall motion abnormalities. Global longitudinal strain performed but not reported based on interpreter judgement due to suboptimal tracking. 3D left ventricular ejection fraction analysis performed but not reported based on interpreter judgement due to suboptimal tracking. The left ventricular internal cavity size was normal  in size. There is no left ventricular hypertrophy. Left ventricular diastolic parameters are indeterminate.  Right Ventricle: The right ventricular size is normal. No increase in right ventricular wall thickness. Right ventricular systolic function is normal. There is normal pulmonary artery systolic pressure. The tricuspid regurgitant velocity is 1.41 m/s, and with an assumed right atrial pressure of 8 mmHg, the estimated right ventricular systolic pressure is 16.0 mmHg.  Left Atrium:  Left atrial size was moderately dilated.  Right Atrium: Right atrial size was mildly dilated.  Pericardium: There is no evidence of pericardial effusion.  Mitral Valve: The mitral valve is normal in structure. Mild mitral annular calcification. Trivial mitral valve regurgitation. No evidence of mitral valve stenosis.  Tricuspid Valve: The tricuspid valve is normal in structure. Tricuspid valve regurgitation is trivial. No evidence of tricuspid stenosis.  Aortic Valve: The aortic valve is tricuspid. Aortic valve regurgitation is not visualized. No aortic stenosis is present. Aortic valve mean gradient measures 4.0 mmHg. Aortic valve peak gradient measures 8.5 mmHg. Aortic valve area, by VTI measures 2.66 cm.  Pulmonic Valve: The pulmonic valve was not well visualized. Pulmonic valve regurgitation is trivial. No evidence of pulmonic stenosis.  Aorta: The aortic root and ascending aorta are structurally normal, with no evidence of dilitation and the aortic arch was not well visualized.  Venous: The inferior vena cava was not well visualized. The inferior vena cava is normal in size with greater than 50% respiratory variability, suggesting right atrial pressure of 3 mmHg.  IAS/Shunts: The atrial septum is grossly normal.   LEFT VENTRICLE PLAX 2D LVIDd:         4.85 cm      Diastology LVIDs:         3.00 cm      LV e' medial:    9.57 cm/s LV PW:         0.90 cm      LV E/e' medial:  10.8 LV IVS:        1.05 cm      LV e' lateral:   11.70 cm/s LVOT diam:     2.00 cm      LV E/e' lateral: 8.8 LV SV:         80 LV SV Index:   37 LVOT Area:     3.14 cm  LV Volumes (MOD) LV vol d, MOD A2C: 104.0 ml LV vol d, MOD A4C: 96.6 ml LV vol s, MOD A2C: 40.1 ml LV vol s, MOD A4C: 33.2 ml LV SV MOD A2C:     63.9 ml LV SV MOD A4C:     96.6 ml LV SV MOD BP:      65.1 ml  RIGHT VENTRICLE RV S prime:     15.20 cm/s TAPSE (M-mode): 3.5 cm  LEFT ATRIUM             Index        RIGHT ATRIUM            Index LA diam:        3.90 cm 1.79 cm/m   RA Area:     16.80 cm LA Vol (A2C):   98.0 ml 45.03 ml/m  RA Volume:   44.20 ml  20.31 ml/m LA Vol (A4C):   69.4 ml 31.89 ml/m LA Biplane Vol: 91.8 ml 42.18 ml/m AORTIC VALVE                    PULMONIC VALVE AV Area (Vmax):  2.43 cm     PV Vmax:          1.49 m/s AV Area (Vmean):   2.54 cm     PV Peak grad:     8.9 mmHg AV Area (VTI):     2.66 cm     PR End Diast Vel: 7.08 msec AV Vmax:           146.00 cm/s AV Vmean:          96.300 cm/s AV VTI:            0.300 m AV Peak Grad:      8.5 mmHg AV Mean Grad:      4.0 mmHg LVOT Vmax:         113.00 cm/s LVOT Vmean:        77.900 cm/s LVOT VTI:          0.254 m LVOT/AV VTI ratio: 0.85  AORTA Ao Root diam: 3.40 cm Ao Asc diam:  3.20 cm  MITRAL VALVE                TRICUSPID VALVE MV Area (PHT): 3.13 cm     TR Peak grad:   8.0 mmHg MV Decel Time: 242 msec     TR Vmax:        141.00 cm/s MV E velocity: 103.00 cm/s MV A velocity: 115.00 cm/s  SHUNTS MV E/A ratio:  0.90         Systemic VTI:  0.25 m Systemic Diam: 2.00 cm  Jodelle Red MD Electronically signed by Jodelle Red MD Signature Date/Time: 08/02/2021/2:33:39 PM    Final             Risk Assessment/Calculations:    CHA2DS2-VASc Score =    {Click here to calculate score.  REFRESH note before signing. :1} This indicates a  % annual risk of stroke. The patient's score is based upon:      No BP recorded.  {Refresh Note OR Click here to enter BP  :1}***       Physical Exam:   VS:  There were no vitals taken for this visit.   Wt Readings from Last 3 Encounters:  08/25/22 214 lb (97.1 kg)  07/27/22 214 lb (97.1 kg)  10/31/21 226 lb 6.4 oz (102.7 kg)    GEN: Well nourished, well developed in no acute distress NECK: No JVD; No carotid bruits CARDIAC: ***RRR, no murmurs, rubs, gallops RESPIRATORY:  Clear to auscultation without rales, wheezing or rhonchi  ABDOMEN: Soft, non-tender,  non-distended EXTREMITIES:  No edema; No deformity   ASSESSMENT AND PLAN: .    Atrial flutter-  HTN-  HLD-    {Are you ordering a CV Procedure (e.g. stress test, cath, DCCV, TEE, etc)?   Press F2        :[id number]}  Dispo: ***  Signed, Alver Sorrow, NP

## 2023-04-16 ENCOUNTER — Other Ambulatory Visit (HOSPITAL_BASED_OUTPATIENT_CLINIC_OR_DEPARTMENT_OTHER): Payer: Self-pay | Admitting: Cardiology

## 2023-04-16 DIAGNOSIS — I10 Essential (primary) hypertension: Secondary | ICD-10-CM

## 2023-06-05 ENCOUNTER — Encounter (HOSPITAL_BASED_OUTPATIENT_CLINIC_OR_DEPARTMENT_OTHER): Payer: Self-pay | Admitting: Cardiology

## 2023-06-05 ENCOUNTER — Ambulatory Visit (HOSPITAL_BASED_OUTPATIENT_CLINIC_OR_DEPARTMENT_OTHER): Payer: 59 | Admitting: Cardiology

## 2023-06-05 VITALS — BP 136/76 | HR 59 | Ht 71.0 in | Wt 227.6 lb

## 2023-06-05 DIAGNOSIS — I10 Essential (primary) hypertension: Secondary | ICD-10-CM | POA: Diagnosis not present

## 2023-06-05 DIAGNOSIS — I483 Typical atrial flutter: Secondary | ICD-10-CM | POA: Insufficient documentation

## 2023-06-05 DIAGNOSIS — R5382 Chronic fatigue, unspecified: Secondary | ICD-10-CM

## 2023-06-05 DIAGNOSIS — E7800 Pure hypercholesterolemia, unspecified: Secondary | ICD-10-CM | POA: Insufficient documentation

## 2023-06-05 DIAGNOSIS — R002 Palpitations: Secondary | ICD-10-CM

## 2023-06-05 DIAGNOSIS — R0683 Snoring: Secondary | ICD-10-CM | POA: Diagnosis not present

## 2023-06-05 NOTE — Patient Instructions (Signed)
 Medication Instructions:  Your physician recommends that you continue on your current medications as directed. Please refer to the Current Medication list given to you today.  *If you need a refill on your cardiac medications before your next appointment, please call your pharmacy*   Testing/Procedures: Your physician recommends an itamar / home sleep study  Follow-Up: At [facility], you and your health needs are our priority.  As part of our continuing mission to provide you with exceptional heart care, we have created designated Provider Care Teams.  These Care Teams include your primary Cardiologist (physician) and Advanced Practice Providers (APPs -  Physician Assistants and Nurse Practitioners) who all work together to provide you with the care you need, when you need it.  We recommend signing up for the patient portal called MyChart.  Sign up information is provided on this After Visit Summary.  MyChart is used to connect with patients for Virtual Visits (Telemedicine).  Patients are able to view lab/test results, encounter notes, upcoming appointments, etc.  Non-urgent messages can be sent to your provider as well.   To learn more about what you can do with MyChart, go to [email or web address].    Your next appointment:   12 month(s)  Provider:   Shelda Bruckner, MD    Other Instructions WatchPAT?  Is a FDA cleared portable home sleep study test that uses a watch and 3 points of contact to monitor 7 different channels, including your heart rate, oxygen saturations, body position, snoring, and chest motion.  The study is easy to use from the comfort of your own home and accurately detect sleep apnea.  Before bed, you attach the chest sensor, attached the sleep apnea bracelet to your nondominant hand, and attach the finger probe.  After the study, the raw data is downloaded from the watch and scored for apnea events.   For more information:  [email or web address]  Patient Testing Instructions:  Do not put battery into the device until bedtime when you are ready to begin the test. Please call the support number if you need assistance after following the instructions below: 24 hour support line- [phone] or ITAMAR support at [phone] (option 2)  Download the Itamar WatchPAT One app through the google play store or App Store  Be sure to turn on or enable access to bluetooth in settlings on your smartphone/ device  Make sure no other bluetooth devices are on and within the vicinity of your smartphone/ device and WatchPAT watch during testing.  Make sure to leave your smart phone/ device plugged in and charging all night.  When ready for bed:  Follow the instructions step by step in the WatchPAT One App to activate the testing device. For additional instructions, including video instruction, visit the WatchPAT One video on Youtube. You can search for WatchPat One within Youtube (video is 4 minutes and 18 seconds) or enter: https://youtube/[email or web address]?[email or web address]_vbiwxE Please note: You will be prompted to enter a Pin to connect via bluetooth when starting the test. The PIN will be assigned to you when you receive the test.  The device is disposable, but it recommended that you retain the device until you receive a call letting you know the study has been received and the results have been interpreted.  We will let you know if the study did not transmit to us  properly after the test is completed. You do not need to call us  to confirm the receipt of the test.  Please complete the test within 48 hours of  receiving PIN.   Frequently Asked Questions:  What is Watch Bruna one?  A single use fully disposable home sleep apnea testing device and will not need to be returned after completion.  What are the requirements to use WatchPAT one?  The be able to have a successful watchpat one sleep study, you should have your Watch pat one  device, your smart phone, watch pat one app, your PIN number and Internet access What type of phone do I need?  You should have a smart phone that uses Android 5.1 and above or any Iphone with IOS 10 and above How can I download the WatchPAT one app?  Based on your device type search for WatchPAT one app either in google play for android devices or APP store for Iphone's Where will I get my PIN for the study?  Your PIN will be provided by your physician's office. It is used for authentication and if you lose/forget your PIN, please reach out to your providers office.  I do not have Internet at home. Can I do WatchPAT one study?  WatchPAT One needs Internet connection throughout the night to be able to transmit the sleep data. You can use your home/local internet or your cellular's data package. However, it is always recommended to use home/local Internet. It is estimated that between 20MB-30MB will be used with each study.However, the application will be looking for space in the phone to start the study.  What happens if I lose internet or bluetooth connection?  During the internet disconnection, your phone will not be able to transmit the sleep data. All the data, will be stored in your phone. As soon as the internet connection is back on, the phone will being sending the sleep data. During the bluetooth disconnection, WatchPAT one will not be able to to send the sleep data to your phone. Data will be kept in the WatchPAT one until two devices have bluetooth connection back on. As soon as the connection is back on, WatchPAT one will send the sleep data to the phone.  How long do I need to wear the WatchPAT one?  After you start the study, you should wear the device at least 6 hours.  How far should I keep my phone from the device?  During the night, your phone should be within 15 feet.  What happens if I leave the room for restroom or other reasons?  Leaving the room for any reason will not  cause any problem. As soon as your get back to the room, both devices will reconnect and will continue to send the sleep data. Can I use my phone during the sleep study?  Yes, you can use your phone as usual during the study. But it is recommended to put your watchpat one on when you are ready to go to bed.  How will I get my study results?  A soon as you completed your study, your sleep data will be sent to the provider. They will then share the results with you when they are ready.

## 2023-06-05 NOTE — Progress Notes (Signed)
 Cardiology Office Note:  .   Date:  06/05/2023  ID:  Shawn Roberts, DOB [birth year], MRN [id number] PCP: Toribio Jerel MATSU, MD  [facility] Providers Cardiologist:  Shelda Bruckner, MD {  History of Present Illness: Shawn Roberts is a 58 y.o. male with a hx of paroxysmal atrial flutter, hypertension, hyperlipidemia, GERD, and obesity who is seen for follow-up. I initially met him 06/17/2021 as a new consult at the request of Practice, [facility] for the evaluation and management of new onset aflutter.   Cardiac history: 05/2021 presented to PCP with HR 130 bpm, found to be in new atrial flutter. Started on diltiazem, told to continue aspirin 162 mg/day, referred to cardiology for further evaluation.   Today: Has had a few episodes of flutter several months back, was during a time of severe stress in his family. Deaths, illnesses, car accidents--very stressful November-December. Longest episode of elevated HR was a few days. Other than elevated HR, didn't notice any symptoms.   Has URI/sinus congestion currently, has struggled with this over the winter. Blood pressure elevated today. Reports BP was good at his recent physical. Checks BP only rarely at home.  Has had some shortness of breath, associated with stress, but has also noted when he has woke up at night to use the bathroom. Feels better if he walks around outside and takes deep breath.   Sleep has been poor. Wife snores so he often sleeps on the couch. Wife does say that he snores.  ROS: Denies chest pain. No orthopnea, LE edema or unexpected weight gain. No syncope. ROS otherwise negative except as noted.   Studies Reviewed: SABRA    EKG:  EKG Interpretation Date/Time:  Tuesday June 05 2023 09:15:40 EST Ventricular Rate:  87 PR Interval:  176 QRS Duration:  94 QT Interval:  362 QTC Calculation: 435 R Axis:   -22  Text Interpretation: Normal sinus rhythm with sinus arrhythmia Normal ECG When compared with  ECG of 12-Dec-2017 10:56, No significant change was found Confirmed by Bruckner Shelda [id number]) on 06/05/2023 9:43:00 AM    Physical Exam:   VS:  BP 136/76   Pulse (!) 59   Ht 5' 11 (1.803 m)   Wt 227 lb 9.6 oz (103.2 kg)   SpO2 97%   BMI 31.74 kg/m    Wt Readings from Last 3 Encounters:  06/05/23 227 lb 9.6 oz (103.2 kg)  08/25/22 214 lb (97.1 kg)  07/27/22 214 lb (97.1 kg)    GEN: Well nourished, well developed in no acute distress HEENT: Normal, moist mucous membranes NECK: No JVD CARDIAC: regular rhythm with rare skipped beat, normal S1 and S2, no rubs or gallops. No murmur. VASCULAR: Radial and DP pulses 2+ bilaterally. No carotid bruits RESPIRATORY:  Clear to auscultation without rales, wheezing or rhonchi  ABDOMEN: Soft, non-tender, non-distended MUSCULOSKELETAL:  Ambulates independently SKIN: Warm and dry, no edema NEUROLOGIC:  Alert and oriented x 3. No focal neuro deficits noted. PSYCHIATRIC:  Normal affect    ASSESSMENT AND PLAN: .    Snoring, fatigue  -stopbang 6 -will order itamar sleep study. Would benefit from CPAP or other therapy if sleep apnea noted given his comorbidities  Atrial flutter -prior ECG consistent with typical atrial flutter -chadsvasc=1, which does not recommend long term anticoagulation. However, if this becomes more frequent/longer lasting, would start anticoagulation and refer to EP for potential ablation -echo normal -has Kardiamobile at home -using diltiazem   Hypertension -better on  recheck, at goal at recent PCP visit per patient -continue valsartan  320 mg daily -continue diltiazem 180 mg daily   Hyperlipidemia: -last lipids per [facility] 08/2022: Tchol 154, HDL 56, LDL 77, TG 81 -continue atorvastatin -stopping aspirin, no known CAD  CV risk counseling and prevention -recommend heart healthy/Mediterranean diet, with whole grains, fruits, vegetable, fish, lean meats, nuts, and olive oil. Limit salt. -recommend moderate walking,  3-5 times/week for 30-50 minutes each session. Aim for at least 150 minutes.week. Goal should be pace of 3 miles/hours, or walking 1.5 miles in 30 minutes -recommend avoidance of tobacco products. Avoid excess alcohol.  Dispo: 1 year   Signed, Shelda Bruckner, MD   Shelda Bruckner, MD, PhD, [facility]  [facility]  [place]  [facility] at [facility] [address] [place], [place] [address] [phone]

## 2023-07-19 ENCOUNTER — Other Ambulatory Visit (HOSPITAL_BASED_OUTPATIENT_CLINIC_OR_DEPARTMENT_OTHER): Payer: Self-pay | Admitting: Cardiology

## 2023-07-19 DIAGNOSIS — I10 Essential (primary) hypertension: Secondary | ICD-10-CM

## 2023-07-24 ENCOUNTER — Telehealth: Payer: Self-pay

## 2023-07-24 NOTE — Telephone Encounter (Signed)
**Note De-Identified Shauntia Levengood Obfuscation** Ordering provider: Dr Cristal Deer Associated diagnoses: Snoring-R06.83 and Fatigue-R53.82 WatchPAT PA obtained on 07/24/2023 by Blake Vetrano, Lorelle Formosa, LPN. Authorization: [id number] Description Sleep study, unattended, simultaneous recording; heart rate, oxygen saturation, respiratory analysis (eg, by airflow or peripheral arterial tone), and sleep time Inquiry summary: Notification/Prior Authorization not required for this service. Patient notified of PIN (1234) on 07/24/2023 Alessandro Griep Notification Method: MyChart message. I got no answer when I called the pt so I also left a message on the pts VM (Ok per [facility]) advising him of his Pin #.  Phone note routed to covering staff for follow-up.

## 2023-08-08 ENCOUNTER — Encounter (INDEPENDENT_AMBULATORY_CARE_PROVIDER_SITE_OTHER): Admitting: Cardiology

## 2023-08-08 DIAGNOSIS — G4733 Obstructive sleep apnea (adult) (pediatric): Secondary | ICD-10-CM | POA: Diagnosis not present

## 2023-08-14 NOTE — Procedures (Signed)
 SLEEP STUDY REPORT Patient Information Study Date: 08/08/2023 Patient Name: Shawn Roberts Patient ID: [id number] Birth Date: February 08, 1966 Age: 58 Gender: Male BMI: 31.8 (W=227 lb, H=5' 11'') Referring Physician: Jodelle Red, MD  TEST DESCRIPTION: Home sleep apnea testing was completed using the WatchPat, a Type 1 device, utilizing  peripheral arterial tonometry (PAT), chest movement, actigraphy, pulse oximetry, pulse rate, body position and snore.  AHI was calculated with apnea and hypopnea using valid sleep time as the denominator. RDI includes apneas,  hypopneas, and RERAs. The data acquired and the scoring of sleep and all associated events were performed in  accordance with the recommended standards and specifications as outlined in the AASM Manual for the Scoring of  Sleep and Associated Events 2.2.0 (2015).   FINDINGS:   1. Mild Obstructive Sleep Apnea with AHI 5.5/hr overall but severe moderate during REM sleep with REM AHI 20.4/hr.  2. NoCentral Sleep Apnea with pAHIc 0/hr.   3. Oxygen desaturations as low as 87%.   4. Mild to moderate snoring was present. O2 sats were < 88% for 0.2 min.   5. Total sleep time was 5 hrs and 38 min.   6. 16.7% of total sleep time was spent in REM sleep.   7. Shortened sleep onset latency at 10 min.   8. NormalREM sleep onset latency at 89 min.   9. Total awakenings were 14.  10. Arrhythmia detection: Suggestive of possible brief atrial fibrillation lasting 10 seconds. This is not diagnostic and  further testing with outpatient telemetry monitoring is recommended.  DIAGNOSIS: Mild to Moderate Obstructive Sleep Apnea (G47.33)  Possible brief Atrial Fibrillation  RECOMMENDATIONS: 1. Clinical correlation of these findings is necessary. The decision to treat obstructive sleep apnea (OSA) is usually  based on the presence of apnea symptoms or the presence of associated medical conditions such as Hypertension,  Congestive Heart Failure,  Atrial Fibrillation or Obesity. The most common symptoms of OSA are snoring, gasping for  breath while sleeping, daytime sleepiness and fatigue.   2. Initiating apnea therapy is recommended given the presence of symptoms and/or associated conditions.  Recommend proceeding with one of the following:   a. Auto-CPAP therapy with a pressure range of 5-20cm H2O.   b. An oral appliance (OA) that can be obtained from certain dentists with expertise in sleep medicine. These are  primarily of use in non-obese patients with mild and moderate disease.   c. An ENT consultation which may be useful to look for specific causes of obstruction and possible treatment  options.   d. If patient is intolerant to PAP therapy, consider referral to ENT for evaluation for hypoglossal nerve stimulator.   3. Close follow-up is necessary to ensure success with CPAP or oral appliance therapy for maximum benefit .  4. A follow-up oximetry study on CPAP is recommended to assess the adequacy of therapy and determine the need  for supplemental oxygen or the potential need for Bi-level therapy. An arterial blood gas to determine the adequacy of  baseline ventilation and oxygenation should also be considered.  5. Healthy sleep recommendations include: adequate nightly sleep (normal 7-9 hrs/night), avoidance of caffeine after  noon and alcohol near bedtime, and maintaining a sleep environment that is cool, dark and quiet.  6. Weight loss for overweight patients is recommended. Even modest amounts of weight loss can significantly  improve the severity of sleep apnea.  7. Snoring recommendations include: weight loss where appropriate, side sleeping, and avoidance of alcohol before  bed.  8. Operation of motor vehicle should be avoided when sleepy.  Signature: Armanda Magic, MD; [facility]; Diplomat, American Board of Sleep  Medicine Electronically Signed: 08/14/2023 10:01:14 AM

## 2023-08-17 ENCOUNTER — Telehealth: Payer: Self-pay

## 2023-08-17 NOTE — Telephone Encounter (Signed)
 Left VM, per DPR, with callback number for sleep study results and recommendations.

## 2023-08-17 NOTE — Telephone Encounter (Signed)
-----   Message from Armanda Magic sent at 08/14/2023 10:02 AM EDT ----- Please let patient know that they have sleep apnea and recommend treating with CPAP.  Please order an auto CPAP from 4-15cm H2O with heated humidity and mask of choice.  Order overnight pulse ox on CPAP.  Followup with me in 6 weeks.

## 2023-08-28 ENCOUNTER — Telehealth: Payer: Self-pay

## 2023-08-28 NOTE — Telephone Encounter (Addendum)
 Notified patient of sleep study results and recommendations. All questions were answered and patient verbalized understanding. CPAP order placed with [facility] today.  ----- Message from Armanda Magic sent at 08/14/2023 10:02 AM EDT ----- Please let patient know that they have sleep apnea and recommend treating with CPAP.  Please order an auto CPAP from 4-15cm H2O with heated humidity and mask of choice.  Order overnight pulse ox on CPAP.  Followup with me in 6 weeks.

## 2023-09-12 ENCOUNTER — Telehealth: Payer: Self-pay

## 2023-09-12 NOTE — Telephone Encounter (Signed)
   Pre-operative Risk Assessment    Patient Name: Shawn Roberts  DOB: 17-Dec-1965 MRN: [id number]   Date of last office visit: 06/05/23 WITH DR. Veryl Gottron Date of next office visit: TBD   Request for Surgical Clearance    Procedure:   LEFT TOTAL KNEE ARTHOPLASTY  Date of Surgery:  Clearance 11/26/23                                Surgeon:  DR. MATTHEW OLIN Surgeon's Group or Practice Name:  [facility] Phone number:  [phone] Fax number:  [phone]   Type of Clearance Requested:   - Medical    Type of Anesthesia:  Spinal   Additional requests/questions:    SignedVira Grieves   09/12/2023, 4:43 PM

## 2023-09-13 NOTE — Telephone Encounter (Signed)
   Name: Shawn Roberts  DOB: [birth year]  MRN: [id number]  Primary Cardiologist: Sheryle Donning, MD   Preoperative team, please contact this patient and set up a phone call appointment for further preoperative risk assessment. Please obtain consent and complete medication review. Thank you for your help.  I confirm that guidance regarding antiplatelet and oral anticoagulation therapy has been completed and, if necessary, noted below.  None requested  I also confirmed the patient resides in the state of [place] . As per [facility] Medical Board telemedicine laws, the patient must reside in the state in which the provider is licensed.   Carie Charity, NP 09/13/2023, 6:56 AM [facility]

## 2023-09-13 NOTE — Telephone Encounter (Signed)
1st attempt to reach pt regarding surgical clearance and the need for a tele visit.  Left a message for pt to call back and ask for the preop team. 

## 2023-09-14 NOTE — Telephone Encounter (Signed)
 2ND attempt to reach pt regarding surgical clearance and the need for an TELE appointment.  Left pt a detailed message to call back and get that scheduled.

## 2023-09-17 ENCOUNTER — Telehealth: Payer: Self-pay

## 2023-09-17 NOTE — Telephone Encounter (Signed)
 Spoke with patient who is agreeable to do a tele visit on 6/18 at 8:40 am. Consent done, med rec needs to be completed.

## 2023-09-17 NOTE — Telephone Encounter (Signed)
  Patient Consent for Virtual Visit        Shawn Roberts has provided verbal consent on 09/17/2023 for a virtual visit (video or telephone).   CONSENT FOR VIRTUAL VISIT FOR:  Shawn Roberts  By participating in this virtual visit I agree to the following:  I hereby voluntarily request, consent and authorize [facility] and its employed or contracted physicians, physician assistants, nurse practitioners or other licensed health care professionals (the Practitioner), to provide me with telemedicine health care services (the "Services") as deemed necessary by the treating Practitioner. I acknowledge and consent to receive the Services by the Practitioner via telemedicine. I understand that the telemedicine visit will involve communicating with the Practitioner through live audiovisual communication technology and the disclosure of certain medical information by electronic transmission. I acknowledge that I have been given the opportunity to request an in-person assessment or other available alternative prior to the telemedicine visit and am voluntarily participating in the telemedicine visit.  I understand that I have the right to withhold or withdraw my consent to the use of telemedicine in the course of my care at any time, without affecting my right to future care or treatment, and that the Practitioner or I may terminate the telemedicine visit at any time. I understand that I have the right to inspect all information obtained and/or recorded in the course of the telemedicine visit and may receive copies of available information for a reasonable fee.  I understand that some of the potential risks of receiving the Services via telemedicine include:  Delay or interruption in medical evaluation due to technological equipment failure or disruption; Information transmitted may not be sufficient (e.g. poor resolution of images) to allow for appropriate medical decision making by the Practitioner;  and/or  In rare instances, security protocols could fail, causing a breach of personal health information.  Furthermore, I acknowledge that it is my responsibility to provide information about my medical history, conditions and care that is complete and accurate to the best of my ability. I acknowledge that Practitioner's advice, recommendations, and/or decision may be based on factors not within their control, such as incomplete or inaccurate data provided by me or distortions of diagnostic images or specimens that may result from electronic transmissions. I understand that the practice of medicine is not an exact science and that Practitioner makes no warranties or guarantees regarding treatment outcomes. I acknowledge that a copy of this consent can be made available to me via my patient portal [facility] MyChart), or I can request a printed copy by calling the office of [facility].    I understand that my insurance will be billed for this visit.   I have read or had this consent read to me. I understand the contents of this consent, which adequately explains the benefits and risks of the Services being provided via telemedicine.  I have been provided ample opportunity to ask questions regarding this consent and the Services and have had my questions answered to my satisfaction. I give my informed consent for the services to be provided through the use of telemedicine in my medical care

## 2023-09-17 NOTE — Telephone Encounter (Signed)
 Pt returning call to nurse to schedule for pre-op appt

## 2023-11-07 ENCOUNTER — Ambulatory Visit: Attending: Cardiology | Admitting: Student

## 2023-11-07 DIAGNOSIS — Z01810 Encounter for preprocedural cardiovascular examination: Secondary | ICD-10-CM

## 2023-11-07 NOTE — Progress Notes (Signed)
 Virtual Visit via Telephone Note   Because of Shawn Roberts's co-morbid illnesses, he is at least at moderate risk for complications without adequate follow up.  This format is felt to be most appropriate for this patient at this time.  The patient did not have access to video technology/had technical difficulties with video requiring transitioning to audio format only (telephone).  All issues noted in this document were discussed and addressed.  No physical exam could be performed with this format.  Please refer to the patient's chart for his consent to telehealth for [facility].  Evaluation Performed:  Preoperative cardiovascular risk assessment _____________   Date:  11/07/2023   Patient ID:  Shawn Roberts, DOB 1966/01/02, MRN [id number] Patient Location:  Home Provider location:   Office  Primary Care Provider:  Leesa Pulling, MD Primary Cardiologist:  Sheryle Donning, MD  Chief Complaint / Patient Profile   58 y.o. y/o male with a h/o typical a-flutter, hypertension, hyperlipidemia who is pending left total knee arthroplasty by Dr. Bernard Brick and presents today for telephonic preoperative cardiovascular risk assessment.  History of Present Illness    Shawn Roberts is a 58 y.o. male who presents via audio/video conferencing for a telehealth visit today.  Pt was last seen in cardiology clinic on 06/05/2023 by Dr. Veryl Gottron.  At that time RAMELO OETKEN was stable from a cardiac standpoint.  The patient is now pending procedure as outlined above. Since his last visit, he is doing well. Patient denies shortness of breath, dyspnea on exertion, lower extremity edema, orthopnea or PND. No chest pain, pressure, or tightness. No palpitations for quite some time. 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 He performs light to heavy yard work daily.   Past Medical History    Past Medical History:  Diagnosis Date   Allergy    Arthritis    knee, shoulder, elbow   DOE  (dyspnea on exertion)    usually with allergies or sinus problems   Environmental allergies    GERD (gastroesophageal reflux disease)    Hyperlipidemia    Hypertension    Kidney stones    Past Surgical History:  Procedure Laterality Date   CARPAL TUNNEL RELEASE     right   ELBOW ARTHROSCOPY Right 06/17/2015   Procedure: ARTHROSCOPY RIGHT ELBOW WITH EXTENSIVE DEBRIDEMENT;  Surgeon: Ferd Householder, MD;  Location: [facility];  Service: Orthopedics;  Laterality: Right;   ENDOSCOPIC CONCHA BULLOSA RESECTION Right 12/18/2017   Procedure: RIGHT ENDOSCOPIC CONCHA BULLOSA RESECTION;  Surgeon: Reynold Caves, MD;  Location: [facility];  Service: ENT;  Laterality: Right;   KNEE ARTHROSCOPY     NASAL SEPTOPLASTY W/ TURBINOPLASTY Bilateral 12/18/2017   Procedure: NASAL SEPTOPLASTY WITH TURBINATE REDUCTION;  Surgeon: Reynold Caves, MD;  Location: [facility];  Service: ENT;  Laterality: Bilateral;   REPLACEMENT TOTAL KNEE Right 05/25/2021   SHOULDER ARTHROSCOPY WITH DEBRIDEMENT AND BICEP TENDON REPAIR Right 07/15/2013   Procedure: RIGHT SHOULDER ARTHROSCOPY WITH LABERAL DEBRIDMENT   ;  Surgeon: Glo Larch, MD;  Location: [facility];  Service: Orthopedics;  Laterality: Right;   WRIST FRACTURE SURGERY     right hand    Allergies  Allergies  Allergen Reactions   Sulfonamide Derivatives     REACTION: hives    Home Medications    Prior to Admission medications   Medication Sig Start Date End Date Taking? Authorizing Provider  albuterol (VENTOLIN HFA) 108 (90  Base) MCG/ACT inhaler     [provider]  atorvastatin (LIPITOR) 20 MG tablet Take 20 mg by mouth daily.    [provider]  celecoxib (CELEBREX) 200 MG capsule Take 1 capsule by mouth in the morning and at bedtime.    [provider]  diltiazem (CARDIZEM CD) 180 MG 24 hr capsule Take 180 mg by mouth daily. 06/08/21   [provider]  methocarbamol (ROBAXIN) 500 MG tablet  methocarbamol 500 mg tablet  TAKE 1 TABLET BY MOUTH EVERY 6 HOURS AS NEEDED FOR MUSCLE SPAMS    [provider]  omeprazole (PRILOSEC) 40 MG capsule Take 40 mg by mouth daily. 30-60 minutes before first meal of day    [provider]  tadalafil (CIALIS) 5 MG tablet Take 5 mg by mouth daily as needed. 05/09/21   [provider]  tamsulosin (FLOMAX) 0.4 MG CAPS capsule Take 0.4 mg by mouth daily. 06/11/21   [provider]  testosterone  cypionate (DEPOTESTOSTERONE CYPIONATE) 200 MG/ML injection Inject into the muscle every 14 (fourteen) days.    [provider]  traZODone (DESYREL) 50 MG tablet Take 100 mg by mouth at bedtime as needed. 06/11/21   [provider]  valsartan  (DIOVAN ) 320 MG tablet TAKE ONE (1) TABLET BY MOUTH EVERY DAY NEED TO BE SEEN BEFORE MORE REFILLS 07/19/23   Sheryle Donning, MD    Physical Exam    Vital Signs:  ENZO TREU does not have vital signs available for review today.  Given telephonic nature of communication, physical exam is limited. AAOx3. NAD. Normal affect.  Speech and respirations are unlabored.  Accessory Clinical Findings    Cardiac Studies & Procedures   ______________________________________________________________________________________________     ECHOCARDIOGRAM  ECHOCARDIOGRAM COMPLETE 08/02/2021  Narrative ECHOCARDIOGRAM REPORT    Patient Name:   Shawn Roberts Date of Exam: 08/02/2021 Medical Rec #:  [id number]     Height:       71.0 in Accession #:    [id number]    Weight:       215.5 lb Date of Birth:  Jan 02, 1966     BSA:          2.177 m Patient Age:    58 years      BP:           120/72 mmHg Patient Gender: M             HR:           73 bpm. Exam Location:  Outpatient  Procedure: 2D Echo, Cardiac Doppler and Color Doppler  Indications:    I48.4 Atypical atrial flutter  History:        Patient has no prior history of Echocardiogram examinations. Arrythmias:Atrial  Flutter, Signs/Symptoms:Dyspnea; Risk Factors:Hypertension, Dyslipidemia and Non-Smoker. Patient denies chest pain and leg edema. He does have some DOE.  Sonographer:    Richarda Chance RVT, RDCS (AE), RDMS Referring Phys: [phone] BRIDGETTE CHRISTOPHER  IMPRESSIONS   1. Left ventricular ejection fraction, by estimation, is 60 to 65%. The left ventricle has normal function. The left ventricle has no regional wall motion abnormalities. Left ventricular diastolic parameters are indeterminate. 2. Right ventricular systolic function is normal. The right ventricular size is normal. There is normal pulmonary artery systolic pressure. 3. Left atrial size was moderately dilated. 4. Right atrial size was mildly dilated. 5. The mitral valve is normal in structure. Trivial mitral valve regurgitation. No evidence of mitral stenosis. 6. The aortic valve is tricuspid.  Aortic valve regurgitation is not visualized. No aortic stenosis is present. 7. The inferior vena cava is normal in size with greater than 50% respiratory variability, suggesting right atrial pressure of 3 mmHg.  Comparison(s): No prior Echocardiogram.  Conclusion(s)/Recommendation(s): Normal biventricular function without evidence of hemodynamically significant valvular heart disease.  FINDINGS Left Ventricle: Left ventricular ejection fraction, by estimation, is 60 to 65%. The left ventricle has normal function. The left ventricle has no regional wall motion abnormalities. Global longitudinal strain performed but not reported based on interpreter judgement due to suboptimal tracking. 3D left ventricular ejection fraction analysis performed but not reported based on interpreter judgement due to suboptimal tracking. The left ventricular internal cavity size was normal in size. There is no left ventricular hypertrophy. Left ventricular diastolic parameters are indeterminate.  Right Ventricle: The right ventricular size is normal. No increase  in right ventricular wall thickness. Right ventricular systolic function is normal. There is normal pulmonary artery systolic pressure. The tricuspid regurgitant velocity is 1.41 m/s, and with an assumed right atrial pressure of 8 mmHg, the estimated right ventricular systolic pressure is 16.0 mmHg.  Left Atrium: Left atrial size was moderately dilated.  Right Atrium: Right atrial size was mildly dilated.  Pericardium: There is no evidence of pericardial effusion.  Mitral Valve: The mitral valve is normal in structure. Mild mitral annular calcification. Trivial mitral valve regurgitation. No evidence of mitral valve stenosis.  Tricuspid Valve: The tricuspid valve is normal in structure. Tricuspid valve regurgitation is trivial. No evidence of tricuspid stenosis.  Aortic Valve: The aortic valve is tricuspid. Aortic valve regurgitation is not visualized. No aortic stenosis is present. Aortic valve mean gradient measures 4.0 mmHg. Aortic valve peak gradient measures 8.5 mmHg. Aortic valve area, by VTI measures 2.66 cm.  Pulmonic Valve: The pulmonic valve was not well visualized. Pulmonic valve regurgitation is trivial. No evidence of pulmonic stenosis.  Aorta: The aortic root and ascending aorta are structurally normal, with no evidence of dilitation and the aortic arch was not well visualized.  Venous: The inferior vena cava was not well visualized. The inferior vena cava is normal in size with greater than 50% respiratory variability, suggesting right atrial pressure of 3 mmHg.  IAS/Shunts: The atrial septum is grossly normal.   LEFT VENTRICLE PLAX 2D LVIDd:         4.85 cm      Diastology LVIDs:         3.00 cm      LV e' medial:    9.57 cm/s LV PW:         0.90 cm      LV E/e' medial:  10.8 LV IVS:        1.05 cm      LV e' lateral:   11.70 cm/s LVOT diam:     2.00 cm      LV E/e' lateral: 8.8 LV SV:         80 LV SV Index:   37 LVOT Area:     3.14 cm  LV Volumes (MOD) LV vol  d, MOD A2C: 104.0 ml LV vol d, MOD A4C: 96.6 ml LV vol s, MOD A2C: 40.1 ml LV vol s, MOD A4C: 33.2 ml LV SV MOD A2C:     63.9 ml LV SV MOD A4C:     96.6 ml LV SV MOD BP:      65.1 ml  RIGHT VENTRICLE RV S prime:     15.20 cm/s TAPSE (M-mode): 3.5 cm  LEFT ATRIUM             Index        RIGHT ATRIUM           Index LA diam:        3.90 cm 1.79 cm/m   RA Area:     16.80 cm LA Vol (A2C):   98.0 ml 45.03 ml/m  RA Volume:   44.20 ml  20.31 ml/m LA Vol (A4C):   69.4 ml 31.89 ml/m LA Biplane Vol: 91.8 ml 42.18 ml/m AORTIC VALVE                    PULMONIC VALVE AV Area (Vmax):    2.43 cm     PV Vmax:          1.49 m/s AV Area (Vmean):   2.54 cm     PV Peak grad:     8.9 mmHg AV Area (VTI):     2.66 cm     PR End Diast Vel: 7.08 msec AV Vmax:           146.00 cm/s AV Vmean:          96.300 cm/s AV VTI:            0.300 m AV Peak Grad:      8.5 mmHg AV Mean Grad:      4.0 mmHg LVOT Vmax:         113.00 cm/s LVOT Vmean:        77.900 cm/s LVOT VTI:          0.254 m LVOT/AV VTI ratio: 0.85  AORTA Ao Root diam: 3.40 cm Ao Asc diam:  3.20 cm  MITRAL VALVE                TRICUSPID VALVE MV Area (PHT): 3.13 cm     TR Peak grad:   8.0 mmHg MV Decel Time: 242 msec     TR Vmax:        141.00 cm/s MV E velocity: 103.00 cm/s MV A velocity: 115.00 cm/s  SHUNTS MV E/A ratio:  0.90         Systemic VTI:  0.25 m Systemic Diam: 2.00 cm  Sheryle Donning MD Electronically signed by Sheryle Donning MD Signature Date/Time: 08/02/2021/2:33:39 PM    Final          ______________________________________________________________________________________________       Assessment & Plan    Primary Cardiologist: Sheryle Donning, MD  Preoperative cardiovascular risk assessment.  Left total knee arthroplasty by Dr. Bernard Brick on 11/26/2023.  Chart reviewed as part of pre-operative protocol coverage. According to the RCRI, patient has a 0.4-3.9% risk of MACE. 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   Given past medical history and time since last visit, based on ACC/AHA guidelines, SENG LARCH would be at acceptable risk for the planned procedure without further cardiovascular testing.   Patient was advised that if he develops new symptoms prior to surgery to contact our office to arrange a follow-up appointment.  he verbalized understanding.  I will route this recommendation to the requesting party via Epic fax function.  Please call with questions.  Time:   Today, I have spent 5 minutes with the patient with telehealth technology discussing medical history, symptoms, and management plan.     Morey Ar, NP  11/07/2023, 7:29 AM

## 2024-04-30 ENCOUNTER — Encounter (HOSPITAL_BASED_OUTPATIENT_CLINIC_OR_DEPARTMENT_OTHER): Payer: Self-pay | Admitting: Cardiology

## 2024-05-12 ENCOUNTER — Other Ambulatory Visit (HOSPITAL_BASED_OUTPATIENT_CLINIC_OR_DEPARTMENT_OTHER): Payer: Self-pay | Admitting: Cardiology

## 2024-05-12 DIAGNOSIS — I10 Essential (primary) hypertension: Secondary | ICD-10-CM

## 2024-06-10 ENCOUNTER — Other Ambulatory Visit (HOSPITAL_BASED_OUTPATIENT_CLINIC_OR_DEPARTMENT_OTHER): Payer: Self-pay | Admitting: Cardiology

## 2024-06-10 DIAGNOSIS — I10 Essential (primary) hypertension: Secondary | ICD-10-CM

## 2024-06-17 NOTE — Telephone Encounter (Signed)
 In accordance with refill protocols, please review and address the following requirements before this medication refill can be authorized:  Labs
